# Patient Record
Sex: Female | Born: 1980 | Race: White | Hispanic: No | Smoking: Current every day smoker
Health system: Southern US, Community
[De-identification: ages and names within clinical notes are randomized; demographics above are authoritative.]

## PROBLEM LIST (undated history)

## (undated) DIAGNOSIS — G8929 Other chronic pain: Secondary | ICD-10-CM

## (undated) DIAGNOSIS — IMO0002 Reserved for concepts with insufficient information to code with codable children: Secondary | ICD-10-CM

## (undated) DIAGNOSIS — I2699 Other pulmonary embolism without acute cor pulmonale: Secondary | ICD-10-CM

## (undated) DIAGNOSIS — M549 Dorsalgia, unspecified: Secondary | ICD-10-CM

## (undated) DIAGNOSIS — I82409 Acute embolism and thrombosis of unspecified deep veins of unspecified lower extremity: Secondary | ICD-10-CM

## (undated) HISTORY — PX: TUBAL LIGATION: SHX77

---
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
## 2002-08-03 ENCOUNTER — Emergency Department (HOSPITAL_COMMUNITY): Admission: EM | Admit: 2002-08-03 | Discharge: 2002-08-03 | Payer: Self-pay | Admitting: Internal Medicine

## 2002-08-03 ENCOUNTER — Encounter: Payer: Self-pay | Admitting: *Deleted

## 2002-10-19 ENCOUNTER — Emergency Department (HOSPITAL_COMMUNITY): Admission: EM | Admit: 2002-10-19 | Discharge: 2002-10-19 | Payer: Self-pay | Admitting: Emergency Medicine

## 2002-12-29 ENCOUNTER — Ambulatory Visit (HOSPITAL_COMMUNITY): Admission: RE | Admit: 2002-12-29 | Discharge: 2002-12-29 | Payer: Self-pay | Admitting: Family Medicine

## 2002-12-29 ENCOUNTER — Encounter: Payer: Self-pay | Admitting: Family Medicine

## 2003-05-16 ENCOUNTER — Emergency Department (HOSPITAL_COMMUNITY): Admission: EM | Admit: 2003-05-16 | Discharge: 2003-05-16 | Payer: Self-pay | Admitting: Emergency Medicine

## 2003-08-16 ENCOUNTER — Emergency Department (HOSPITAL_COMMUNITY): Admission: EM | Admit: 2003-08-16 | Discharge: 2003-08-16 | Payer: Self-pay | Admitting: Emergency Medicine

## 2003-12-06 ENCOUNTER — Emergency Department (HOSPITAL_COMMUNITY): Admission: EM | Admit: 2003-12-06 | Discharge: 2003-12-06 | Payer: Self-pay | Admitting: Emergency Medicine

## 2004-01-15 ENCOUNTER — Emergency Department (HOSPITAL_COMMUNITY): Admission: EM | Admit: 2004-01-15 | Discharge: 2004-01-15 | Payer: Self-pay | Admitting: Emergency Medicine

## 2004-02-16 ENCOUNTER — Emergency Department (HOSPITAL_COMMUNITY): Admission: EM | Admit: 2004-02-16 | Discharge: 2004-02-17 | Payer: Self-pay | Admitting: *Deleted

## 2006-01-06 ENCOUNTER — Emergency Department (HOSPITAL_COMMUNITY): Admission: EM | Admit: 2006-01-06 | Discharge: 2006-01-06 | Payer: Self-pay | Admitting: Emergency Medicine

## 2006-02-03 ENCOUNTER — Emergency Department (HOSPITAL_COMMUNITY): Admission: EM | Admit: 2006-02-03 | Discharge: 2006-02-03 | Payer: Self-pay | Admitting: Emergency Medicine

## 2006-03-08 ENCOUNTER — Emergency Department (HOSPITAL_COMMUNITY): Admission: EM | Admit: 2006-03-08 | Discharge: 2006-03-08 | Payer: Self-pay | Admitting: Emergency Medicine

## 2006-04-04 ENCOUNTER — Emergency Department (HOSPITAL_COMMUNITY): Admission: EM | Admit: 2006-04-04 | Discharge: 2006-04-04 | Payer: Self-pay | Admitting: Emergency Medicine

## 2006-05-02 ENCOUNTER — Emergency Department (HOSPITAL_COMMUNITY): Admission: EM | Admit: 2006-05-02 | Discharge: 2006-05-02 | Payer: Self-pay | Admitting: Emergency Medicine

## 2006-05-06 ENCOUNTER — Emergency Department (HOSPITAL_COMMUNITY): Admission: EM | Admit: 2006-05-06 | Discharge: 2006-05-06 | Payer: Self-pay | Admitting: Emergency Medicine

## 2006-06-05 ENCOUNTER — Emergency Department: Payer: Self-pay | Admitting: Emergency Medicine

## 2006-07-06 ENCOUNTER — Observation Stay: Payer: Self-pay

## 2006-10-08 ENCOUNTER — Emergency Department (HOSPITAL_COMMUNITY): Admission: EM | Admit: 2006-10-08 | Discharge: 2006-10-08 | Payer: Self-pay | Admitting: Emergency Medicine

## 2006-12-30 ENCOUNTER — Emergency Department (HOSPITAL_COMMUNITY): Admission: EM | Admit: 2006-12-30 | Discharge: 2006-12-30 | Payer: Self-pay | Admitting: Emergency Medicine

## 2007-02-11 ENCOUNTER — Ambulatory Visit: Payer: Self-pay

## 2007-03-23 ENCOUNTER — Emergency Department: Payer: Self-pay | Admitting: Unknown Physician Specialty

## 2007-04-17 ENCOUNTER — Ambulatory Visit: Payer: Self-pay

## 2007-08-14 ENCOUNTER — Observation Stay: Payer: Self-pay

## 2007-08-24 ENCOUNTER — Inpatient Hospital Stay: Payer: Self-pay

## 2007-12-05 ENCOUNTER — Emergency Department (HOSPITAL_COMMUNITY): Admission: EM | Admit: 2007-12-05 | Discharge: 2007-12-05 | Payer: Self-pay | Admitting: Emergency Medicine

## 2007-12-06 ENCOUNTER — Emergency Department (HOSPITAL_COMMUNITY): Admission: EM | Admit: 2007-12-06 | Discharge: 2007-12-06 | Payer: Self-pay | Admitting: Emergency Medicine

## 2008-10-14 ENCOUNTER — Emergency Department: Payer: Self-pay | Admitting: Internal Medicine

## 2009-02-04 ENCOUNTER — Encounter: Payer: Self-pay | Admitting: Obstetrics and Gynecology

## 2009-04-14 ENCOUNTER — Inpatient Hospital Stay: Payer: Self-pay | Admitting: Obstetrics and Gynecology

## 2009-07-18 ENCOUNTER — Emergency Department (HOSPITAL_COMMUNITY): Admission: EM | Admit: 2009-07-18 | Discharge: 2009-07-18 | Payer: Self-pay | Admitting: Emergency Medicine

## 2009-09-23 ENCOUNTER — Emergency Department (HOSPITAL_COMMUNITY): Admission: EM | Admit: 2009-09-23 | Discharge: 2009-09-23 | Payer: Self-pay | Admitting: Emergency Medicine

## 2009-10-13 ENCOUNTER — Emergency Department (HOSPITAL_COMMUNITY): Admission: EM | Admit: 2009-10-13 | Discharge: 2009-10-13 | Payer: Self-pay | Admitting: Emergency Medicine

## 2010-01-01 ENCOUNTER — Emergency Department (HOSPITAL_COMMUNITY): Admission: EM | Admit: 2010-01-01 | Discharge: 2010-01-01 | Payer: Self-pay | Admitting: Emergency Medicine

## 2010-02-16 ENCOUNTER — Emergency Department (HOSPITAL_COMMUNITY): Admission: EM | Admit: 2010-02-16 | Discharge: 2010-02-16 | Payer: Self-pay | Admitting: Emergency Medicine

## 2010-06-05 ENCOUNTER — Encounter: Payer: Self-pay | Admitting: Neurology

## 2010-06-05 ENCOUNTER — Encounter: Payer: Self-pay | Admitting: Neurosurgery

## 2010-06-18 ENCOUNTER — Inpatient Hospital Stay: Payer: Self-pay | Admitting: Obstetrics and Gynecology

## 2010-07-03 ENCOUNTER — Emergency Department (HOSPITAL_COMMUNITY): Payer: Medicaid Other

## 2010-07-03 ENCOUNTER — Encounter (HOSPITAL_COMMUNITY): Payer: Self-pay

## 2010-07-03 ENCOUNTER — Inpatient Hospital Stay (HOSPITAL_COMMUNITY)
Admission: RE | Admit: 2010-07-03 | Discharge: 2010-07-05 | DRG: 776 | Disposition: A | Discharge status: Home Health | Payer: Medicaid Other | Source: Ambulatory Visit | Attending: Internal Medicine | Admitting: Internal Medicine

## 2010-07-03 DIAGNOSIS — Z832 Family history of diseases of the blood and blood-forming organs and certain disorders involving the immune mechanism: Secondary | ICD-10-CM

## 2010-07-03 DIAGNOSIS — F17200 Nicotine dependence, unspecified, uncomplicated: Secondary | ICD-10-CM | POA: Diagnosis present

## 2010-07-03 DIAGNOSIS — Z79899 Other long term (current) drug therapy: Secondary | ICD-10-CM

## 2010-07-03 DIAGNOSIS — O8823 Thromboembolism in the puerperium: Principal | ICD-10-CM | POA: Diagnosis present

## 2010-07-03 LAB — COMPREHENSIVE METABOLIC PANEL
ALT: 23 U/L (ref 0–35)
BUN: 11 mg/dL (ref 6–23)
CO2: 30 mEq/L (ref 19–32)
Calcium: 9 mg/dL (ref 8.4–10.5)
Creatinine, Ser: 0.7 mg/dL (ref 0.4–1.2)
GFR calc non Af Amer: >60 mL/min (ref >60)
Glucose, Bld: 101 mg/dL — ABNORMAL HIGH (ref 70–99)
Sodium: 140 mEq/L (ref 135–145)

## 2010-07-03 LAB — CBC
MCV: 81.5 fL (ref 78.0–100.0)
Platelets: 289 10*3/uL (ref 150–400)
RBC: 4.43 MIL/uL (ref 3.87–5.11)
WBC: 8.3 10*3/uL (ref 4.0–10.5)

## 2010-07-03 LAB — POCT CARDIAC MARKERS: Myoglobin, poc: 57.1 ng/mL (ref 12–200)

## 2010-07-03 LAB — DIFFERENTIAL
Eosinophils Absolute: 0.2 10*3/uL (ref 0.0–0.7)
Lymphs Abs: 1.6 10*3/uL (ref 0.7–4.0)
Neutrophils Relative %: 74 % (ref 43–77)

## 2010-07-03 LAB — D-DIMER, QUANTITATIVE: D-Dimer, Quant: 4.77 ug/mL-FEU — ABNORMAL HIGH (ref 0.00–0.48)

## 2010-07-03 MED ORDER — IOHEXOL 350 MG/ML SOLN
100.0000 mL | Freq: Once | INTRAVENOUS | Status: AC | PRN
  Administered 2010-07-03: 100 mL via INTRAVENOUS

## 2010-07-04 LAB — CARDIAC PANEL(CRET KIN+CKTOT+MB+TROPI)
Relative Index: INVALID (ref 0.0–2.5)
Relative Index: INVALID (ref 0.0–2.5)
Total CK: 35 U/L (ref 7–177)
Total CK: 32 U/L (ref 7–177)
Troponin I: <0.01 ng/mL (ref 0.00–0.06)
Troponin I: <0.01 ng/mL (ref 0.00–0.06)

## 2010-07-04 LAB — IRON AND TIBC
TIBC: 379 ug/dL (ref 250–470)
UIBC: 361 ug/dL

## 2010-07-04 LAB — VITAMIN B12: Vitamin B-12: 315 pg/mL (ref 211–911)

## 2010-07-04 LAB — COMPREHENSIVE METABOLIC PANEL
ALT: 21 U/L (ref 0–35)
Alkaline Phosphatase: 77 U/L (ref 39–117)
CO2: 28 mEq/L (ref 19–32)
Chloride: 100 mEq/L (ref 96–112)
GFR calc non Af Amer: >60 mL/min (ref >60)
Glucose, Bld: 96 mg/dL (ref 70–99)
Potassium: 3.6 mEq/L (ref 3.5–5.1)
Sodium: 136 mEq/L (ref 135–145)

## 2010-07-04 LAB — CBC
HCT: 33.8 % — ABNORMAL LOW (ref 36.0–46.0)
Hemoglobin: 10.3 g/dL — ABNORMAL LOW (ref 12.0–15.0)
RBC: 4.16 MIL/uL (ref 3.87–5.11)
WBC: 8.3 10*3/uL (ref 4.0–10.5)

## 2010-07-04 LAB — DIFFERENTIAL
Basophils Absolute: 0 10*3/uL (ref 0.0–0.1)
Lymphocytes Relative: 29 % (ref 12–46)
Monocytes Absolute: 0.5 10*3/uL (ref 0.1–1.0)
Neutro Abs: 5.3 10*3/uL (ref 1.7–7.7)

## 2010-07-04 LAB — PROTIME-INR: Prothrombin Time: 13.3 seconds (ref 11.6–15.2)

## 2010-07-04 LAB — FOLATE: Folate: 5.8 ng/mL

## 2010-07-05 LAB — PROTIME-INR
INR: 1.03 (ref 0.00–1.49)
Prothrombin Time: 13.7 seconds (ref 11.6–15.2)

## 2010-07-05 LAB — DIFFERENTIAL
Basophils Absolute: 0 10*3/uL (ref 0.0–0.1)
Basophils Relative: 0 % (ref 0–1)
Eosinophils Absolute: 0.1 10*3/uL (ref 0.0–0.7)
Monocytes Absolute: 0.5 10*3/uL (ref 0.1–1.0)
Neutro Abs: 4.4 10*3/uL (ref 1.7–7.7)

## 2010-07-05 LAB — CBC
Hemoglobin: 10.3 g/dL — ABNORMAL LOW (ref 12.0–15.0)
MCHC: 30.2 g/dL (ref 30.0–36.0)
Platelets: 339 10*3/uL (ref 150–400)
RDW: 15.8 % — ABNORMAL HIGH (ref 11.5–15.5)

## 2010-07-10 NOTE — Discharge Summary (Signed)
  NAMEAMESHA, [name]                 ACCOUNT NO.:  [id number]  MEDICAL RECORD NO.:  [id number]           PATIENT TYPE:  I  LOCATION:  A305                          FACILITY:  [facility]  PHYSICIAN:  Brysun Eschmann L. Lendell Caprice, MDDATE OF BIRTH:  02/24/1981  DATE OF ADMISSION:  07/03/2010 DATE OF DISCHARGE:  02/21/2012LH                              DISCHARGE SUMMARY   DISCHARGE DIAGNOSES: 1. Acute pulmonary embolus. 2. Recent vaginal delivery of a full-term infant. 3. Tobacco abuse. 4. Chronic methadone maintenance.  DISCHARGE MEDICATIONS: 1. Lovenox 120 mg daily until Saturday or continue until INR is     greater than or equal to 2.0. 2. Coumadin 5 mg nightly or as directed to keep INR between 2 and 3. 3. Methadone 200 mg a day.  CONDITION:  Stable.  ACTIVITY:  Ad lib.  Diet should be Coumadin compatible.  FOLLOWUP:  Dr. Delbert Harness next week for Coumadin management.  Home health will draw INR on Friday and then daily if less than 2.0.  They also will assist with her Lovenox injections.  CONSULTATIONS:  None.  PROCEDURES:  None.  LABORATORY DATA:  CBC on admission significant for a hemoglobin of 11, hematocrit 36, otherwise normal D-dimer 4.77.  Complete metabolic panel significant for an albumin of 2.9.  Cardiac enzymes negative.  Ferritin is 19, normal folate, normal B12, normal TIBC, iron is 18.  DIAGNOSTICS:  Chest x-ray showed nothing acute.  CT angiogram of the chest showed left lower lobe segmental and subsegmental pulmonary emboli.  EKG showed normal sinus rhythm.  HISTORY AND HOSPITAL COURSE:  Please see H and P for details.  Ann Hoover is a 30 year old white female who has seen Dr. Delbert Harness in the past, but is not followed by him regularly.  She had a vaginal delivery 12 days ago.  She presented with chest pain.  She had normal vital signs and clear lungs, regular heart rhythm.  She was found to have a PE, which is most likely related to the recent delivery and also her  tobacco abuse.  She was started on Lovenox and Coumadin.  She is breast-feeding and safety and nursing mother's of Lovenox and Coumadin was confirmed with the pharmacist.  She was requesting discharge as soon as possible to get back to her infant and five other children.  She was given Lovenox teaching.  She felt comfortable self-administering Lovenox, and we will send her home with a nurse to assist and check INRs.  She wishes to see Dr. Delbert Harness again, and I have asked the secretary to set up an appointment.  I encouraged her to quit smoking.  Her vital signs, Telemetry have been stable and she may be safely discharged with a 5-day of bridge of Coumadin and Lovenox.  Total time on the day of discharge is greater than 30 minutes.     Ann Hoover L. Lendell Caprice, MD     CLS/MEDQ  D:  07/05/2010  T:  07/06/2010  Job:  [id number]  cc:   Delbert Harness, MD  Electronically Signed by Crista Curb MD on 07/10/2010 09:28:49 PM

## 2010-07-11 NOTE — H&P (Signed)
Ann Hoover, Ann Hoover NO.:  [id number]  MEDICAL RECORD NO.:  [id number]           PATIENT TYPE:  E  LOCATION:  [facility]                          FACILITY:  [facility]  PHYSICIAN:  Eduard Clos, MDDATE OF BIRTH:  [birth year]  DATE OF ADMISSION:  07/03/2010 DATE OF DISCHARGE:  LH                             HISTORY & PHYSICAL   PRIMARY CARE PHYSICIAN:  Unassigned.  CHIEF COMPLAINT:  Chest pain.  HISTORY OF PRESENT ILLNESS:  A 29 year old female who had just delivered a baby 12 days ago, started having some chest pain on the left side, pleuritic in nature.  Denies any shortness of breath, cough, fever, or chills.  As the chest has persisted, the patient came to the ER where her D-dimer was found to be high.  Due to this, the patient was admitted for further workup.  The patient states that she did notice some swelling of her lower extremity few days ago, which has resolved at this time.  Denies any nausea, vomiting, or abdominal pain.  Denies any dysuria, discharge, or diarrhea.  PAST MEDICAL HISTORY:  Nothing significant.  PAST SURGICAL HISTORY:  None.  MEDICATIONS PRIOR TO ADMISSION:  None.  SOCIAL HISTORY:  The patient smokes cigarettes.  Denies any alcohol or drug abuse.  FAMILY HISTORY:  Strongly positive for PE in one of her uncles and 2 of her aunts had DVT.  ALLERGIES:  No known drug allergies.  REVIEW OF SYSTEMS:  As per the history of present illness, nothing else significant.  PHYSICAL EXAMINATION:  GENERAL:  The patient examined at bedside, not in acute distress. VITAL SIGNS:  Blood pressure 126/50, pulse 80 per minute, temperature 98.1, respirations 18 per minute, O2 sats 100%. HEENT:  Anicteric.  No pallor.  No discharge from ears, eyes, nose, or mouth. CHEST:  Bilateral air entry present.  No rhonchi.  No crepitation. HEART:  S1 and S2 heard. ABDOMEN:  Soft, nontender.  Bowel sounds heard. NEUROLOGIC:  Alert, awake, and oriented to  place and person. EXTREMITIES:  Moves upper and lower extremities, 5/5 strength. Peripheral pulses felt.  No edema.  LABORATORY DATA:  EKG shows normal sinus rhythm with beats around 57 beats per minute with nonspecific ST-T changes.  CBC; WBC is 8.3, hemoglobin 11.1, hematocrit 36.1, platelets 289.  D-dimer is 4.77. Complete metabolic panel; sodium 140, potassium 3.8, chloride 103, carbon dioxide 30, glucose 111, BUN 11, creatinine 0.7, alkaline phosphatase 89, AST 17, ALT 23, total protein 6.6, albumin 2.9, calcium 9, CK-MB less than 1, troponin less than 0.05, myoglobin is 57.1.  CT angio of the chest shows left lower lobe segmental and subsegmental pulmonary emboli.  Chest x-ray shows no active cardiopulmonary process.  ASSESSMENT: 1. Pulmonary embolism. 2. Pleuritic type of chest pain. 3. Tobacco abuse. 4. We will admit the patient to telemetry. 5. For her PE, the patient is already started on Lovenox and Coumadin.     The patient does have a strong family history of PE and DVTs.  At this time, the patient will be continued on Lovenox and Coumadin with bridging.  I already confirmed  the safety of Lovenox and Coumadin for a nursing mother with the pharmacist.  The patient will need hypercoagulable workup after the patient has taken Coumadin at least for 6 months.  At this time, the patient has already got Lovenox.  The patient will need tobacco cessation counseling.  The patient does have anemia.  We will check anemia profile.  Further recommendation as condition evolves.     Eduard Clos, MD     ANK/MEDQ  D:  07/04/2010  T:  07/04/2010  Job:  [id number]  Electronically Signed by Midge Minium MD on 07/11/2010 05:00:14 PM

## 2010-07-27 LAB — DIFFERENTIAL
Basophils Absolute: 0.1 10*3/uL (ref 0.0–0.1)
Basophils Relative: 1 % (ref 0–1)
Lymphocytes Relative: 25 % (ref 12–46)
Neutro Abs: 6.1 10*3/uL (ref 1.7–7.7)
Neutrophils Relative %: 67 % (ref 43–77)

## 2010-07-27 LAB — CBC
MCHC: 33.8 g/dL (ref 30.0–36.0)
Platelets: 184 10*3/uL (ref 150–400)
RDW: 15 % (ref 11.5–15.5)
WBC: 9.1 10*3/uL (ref 4.0–10.5)

## 2010-07-27 LAB — BASIC METABOLIC PANEL
BUN: 5 mg/dL — ABNORMAL LOW (ref 6–23)
Calcium: 9.1 mg/dL (ref 8.4–10.5)
Creatinine, Ser: 0.55 mg/dL (ref 0.4–1.2)
GFR calc non Af Amer: >60 mL/min (ref >60)
Glucose, Bld: 90 mg/dL (ref 70–99)
Potassium: 3.6 mEq/L (ref 3.5–5.1)

## 2010-07-27 LAB — ABO/RH: ABO/RH(D): A POS

## 2010-07-27 LAB — HCG, QUANTITATIVE, PREGNANCY: hCG, Beta Chain, Quant, S: 11390 m[IU]/mL — ABNORMAL HIGH (ref <5)

## 2010-08-02 LAB — URINALYSIS, ROUTINE W REFLEX MICROSCOPIC
Glucose, UA: NEGATIVE mg/dL
Nitrite: POSITIVE — AB
Protein, ur: NEGATIVE mg/dL

## 2010-08-02 LAB — POCT PREGNANCY, URINE: Preg Test, Ur: NEGATIVE

## 2010-08-02 LAB — URINE MICROSCOPIC-ADD ON

## 2010-09-30 NOTE — Op Note (Signed)
[facility]  Patient:    Ann Hoover, Ann Hoover Visit Number: [id number] MRN: [id number]          Service Type: MED Location: 4A A418 01 Attending Physician:  Tilda Burrow Dictated by:   Christin Bach, M.D. Proc. Date: 06/02/01 Admit Date:  06/02/2001   CC:         Lilyan Punt, M.D.   Operative Report  DELIVERY NOTE  DELIVERY TIME:  2:55 p.m.  DESCRIPTION OF PROCEDURE:  Ann Hoover progressed nicely progressing from 6 cm to completely dilated in less than one hour on Pitocin augmentation of labor. Intravenous analgesia was very effective.  She began to develop variable decelerations with heart rate remaining in the 70s during second stage labor Some vacuum assistance was requested utilized in assisting to bring the baby to crowning position whereupon it was removed, and she was able to expel the baby the rest of the way.  The infant was a large stocky 7 pound 13.6 ounce female, Apgars 8 and 9 without any obvious nuchal or body cord.  The infant responded well to tactile stimulation and had cord clamped.  Cord blood gas was obtained and placenta delivered easily, Tomasa Blase presentation with a large placenta, 25 cm in approximate diameter, central cord insertion and normal appearance. Estimated blood loss was 250 cc.  Perineum had first degree lacerations only, none requiring suture repair. The patient was attended by two family members. Dictated by:   Christin Bach, M.D. Attending Physician:  Tilda Burrow DD:  06/02/01 TD:  06/04/01 Job: [id number]

## 2010-09-30 NOTE — H&P (Signed)
[facility]  Patient:    Ann Hoover, Ann Hoover Visit Number: [id number] MRN: [id number]          Service Type: MED Location: 4A A418 01 Attending Physician:  Tilda Burrow Dictated by:   Christin Bach, M.D. Admit Date:  06/02/2001   CC:         Dr. [facility]   History and Physical  ADMISSION DIAGNOSES:  1. Pregnancy [redacted] weeks gestation. 2. Spontaneous rupture of membranes. 3. Latent phase labor.  HISTORY OF PRESENT ILLNESS:  This 30 year old female, gravida 2, para 1, AB 0, LMP 08/27/00, placing her [facility] January 20 with ultrasound corrected EDC of February 2, based on 20-week ultrasound; is admitted, after presenting on the morning of June 02, 2001, complaining of spontaneous rupture of membranes occurring at 7:30 a.m. today with no bleeding.  Presentation to labor and delivery at approximately 8:30 a.m. shows fetal well being, reactive strip, with occasional mild contractions that are still irregular in nature. Membranes are ruptured and the amniotic fluid is clear and cervical dilation is 3-cm, 90%, 0 station with cervix still quite posterior and soft.  Prognosis for vaginal delivery is excellent.  The patient is admitted for labor management.  Will receive epidural analgesia most likely, and will require Pitocin augmentation of labor.  PAST MEDICAL HISTORY:  Benign.  PAST SURGICAL HISTORY:  Negative.  ALLERGIES:  No known drug allergies.  SOCIAL HISTORY:  Single, stable relationship with boyfriend for 2 years.  PHYSICAL EXAMINATION:  Height 5 feet 8 inches, weight 135, which is a 24-pound weight gain.  Fundal height 37 cm, vertex presentation.  EYES:  Pupils equal round and reactive.  Extraocular movements intact.  NECK:  Supple.  Trachea midline.  CHEST:  Clear to auscultation.  ABDOMEN:  Fundal height 37 cm, vertex presentation, reactive NST.  EXTREMITIES:  Grossly normal.  ASSESSMENT:  Pregnancy at term,  latent phase labor at best.  PLAN:  Pitocin augmentation.  Good prognosis for vaginal delivery.  ADDENDUM:  Patient aware that Dr. Benjie Karvonen is covering after 3 p.m. Dictated by:   Christin Bach, M.D. Attending Physician:  Tilda Burrow DD:  06/02/01 TD:  06/02/01 Job: [id number]

## 2011-02-04 ENCOUNTER — Encounter (HOSPITAL_COMMUNITY): Payer: Self-pay

## 2011-02-04 ENCOUNTER — Emergency Department (HOSPITAL_COMMUNITY)
Admission: EM | Admit: 2011-02-04 | Discharge: 2011-02-04 | Disposition: A | Discharge status: Home / Self Care | Payer: Medicaid Other | Attending: Emergency Medicine | Admitting: Emergency Medicine

## 2011-02-04 ENCOUNTER — Other Ambulatory Visit: Payer: Self-pay

## 2011-02-04 DIAGNOSIS — N390 Urinary tract infection, site not specified: Secondary | ICD-10-CM

## 2011-02-04 DIAGNOSIS — Z331 Pregnant state, incidental: Secondary | ICD-10-CM | POA: Insufficient documentation

## 2011-02-04 DIAGNOSIS — O219 Vomiting of pregnancy, unspecified: Secondary | ICD-10-CM

## 2011-02-04 DIAGNOSIS — R0789 Other chest pain: Secondary | ICD-10-CM | POA: Insufficient documentation

## 2011-02-04 HISTORY — DX: Reserved for concepts with insufficient information to code with codable children: IMO0002

## 2011-02-04 LAB — POCT PREGNANCY, URINE: Preg Test, Ur: POSITIVE

## 2011-02-04 LAB — DIFFERENTIAL
Basophils Absolute: 0 10*3/uL (ref 0.0–0.1)
Eosinophils Absolute: 0.1 10*3/uL (ref 0.0–0.7)
Eosinophils Relative: 1 % (ref 0–5)
Lymphocytes Relative: 23 % (ref 12–46)
Lymphs Abs: 1.8 10*3/uL (ref 0.7–4.0)
Neutrophils Relative %: 70 % (ref 43–77)

## 2011-02-04 LAB — COMPREHENSIVE METABOLIC PANEL
Alkaline Phosphatase: 65 U/L (ref 39–117)
BUN: 9 mg/dL (ref 6–23)
CO2: 28 mEq/L (ref 19–32)
Chloride: 100 mEq/L (ref 96–112)
Creatinine, Ser: 0.74 mg/dL (ref 0.50–1.10)
GFR calc non Af Amer: >60 mL/min (ref >60)
Glucose, Bld: 97 mg/dL (ref 70–99)
Potassium: 4.4 mEq/L (ref 3.5–5.1)
Total Bilirubin: 0.3 mg/dL (ref 0.3–1.2)

## 2011-02-04 LAB — URINALYSIS, ROUTINE W REFLEX MICROSCOPIC
Glucose, UA: NEGATIVE mg/dL
pH: 6.5 (ref 5.0–8.0)

## 2011-02-04 LAB — URINE MICROSCOPIC-ADD ON

## 2011-02-04 LAB — LIPASE, BLOOD: Lipase: 25 U/L (ref 11–59)

## 2011-02-04 LAB — CBC
Platelets: 251 10*3/uL (ref 150–400)
RBC: 5.09 MIL/uL (ref 3.87–5.11)
RDW: 14.8 % (ref 11.5–15.5)
WBC: 8 10*3/uL (ref 4.0–10.5)

## 2011-02-04 MED ORDER — ONDANSETRON 8 MG PO TBDP
8.0000 mg | ORAL_TABLET | Freq: Once | ORAL | Status: AC
  Administered 2011-02-04: 8 mg via ORAL
  Filled 2011-02-04: qty 1

## 2011-02-04 MED ORDER — CEPHALEXIN 500 MG PO CAPS: 500.0000 mg | ORAL_CAPSULE | Freq: Four times a day (QID) | ORAL | Status: AC

## 2011-02-04 MED ORDER — ONDANSETRON 4 MG PO TBDP: 4.0000 mg | ORAL_TABLET | Freq: Four times a day (QID) | ORAL | Status: AC | PRN

## 2011-02-04 MED ORDER — CEPHALEXIN 500 MG PO CAPS
500.0000 mg | ORAL_CAPSULE | Freq: Once | ORAL | Status: AC
  Administered 2011-02-04: 500 mg via ORAL
  Filled 2011-02-04: qty 1

## 2011-02-04 NOTE — ED Notes (Signed)
Have ask pt to use the bathroom for some unrie

## 2011-02-04 NOTE — ED Provider Notes (Signed)
History  Scribed for Dr. Clarene Duke, the patient was seen in room 19. The chart was scribed by Gilman Schmidt. The patients care was started at 1241.   CSN: [id number] Arrival date & time: 02/04/2011 10:31 AM  Chief Complaint  Patient presents with  . Emesis  . Chest Pain    HPI   HPI Pt. Seen at 1241.Ann Hoover is a 30 y.o. female who presents to the Emergency Department complaining of gradual onset and persistence of multiple intermittent episodes of N/V x10 days.  Has been taking phenergan without relief.  States after vomiting her "chest hurts."   Denies SOB/cough, no abd pain, no back or flank pain, no fevers, no diarrhea, no black or blood in stools or emesis. There are no other associated symptoms and no other alleviating or aggravating factors.  LMP: 2-3 weeks ago.    PAST MEDICAL HISTORY:  Past Medical History  Diagnosis Date  . Herniated disc      PAST SURGICAL HISTORY:  History reviewed. No pertinent past surgical history.   MEDICATIONS:  Previous Medications   No medications on file     ALLERGIES:  Allergies as of 02/04/2011  . (No Known Allergies)      SOCIAL HISTORY: History  Substance Use Topics  . Smoking status: Former Games developer  . Smokeless tobacco: Not on file  . Alcohol Use: No     Review of Systems  Review of Systems ROS: Statement: All systems negative except as marked or noted in the HPI; Constitutional: Negative for fever and chills. ; ; Eyes: Negative for eye pain, redness and discharge. ; ; ENMT: Negative for ear pain, hoarseness, nasal congestion, sinus pressure and sore throat. ; ; Cardiovascular: Negative for chest pain, palpitations, diaphoresis, dyspnea and peripheral edema. ; ; Respiratory: Negative for cough, wheezing and stridor. ; ; Gastrointestinal: +N/V.  Negative for diarrhea and abdominal pain, blood in stool, hematemesis, jaundice and rectal bleeding. . ; ; Genitourinary: Negative for dysuria, flank pain and hematuria. ; ;  Musculoskeletal: Negative for back pain and neck pain. Negative for swelling and trauma.; ; Skin: Negative for pruritus, rash, abrasions, blisters, bruising and skin lesion.; ; Neuro: Negative for headache, lightheadedness and neck stiffness. Negative for weakness, altered level of consciousness , altered mental status, extremity weakness, paresthesias, involuntary movement, seizure and syncope.     Physical Exam    BP 118/51  Pulse 62  Temp(Src) 98.4 F (36.9 C) (Oral)  Resp 18  Ht 5\' 8"  (1.727 m)  Wt 159 lb 11.2 oz (72.439 kg)  BMI 24.28 kg/m2  SpO2 100%  LMP 01/21/2011  Physical Exam 1246: Physical examination:  Nursing notes reviewed; Vital signs and O2 SAT reviewed;  Constitutional: Well developed, Well nourished, Well hydrated, In no acute distress; Head:  Normocephalic, atraumatic; Eyes: EOMI, PERRL, No scleral icterus; ENMT: Mouth and pharynx normal, Mucous membranes moist; Neck: Supple, Full range of motion, No lymphadenopathy; Cardiovascular: Regular rate and rhythm, No murmur, rub, or gallop; Respiratory: Breath sounds clear & equal bilaterally, No rales, rhonchi, wheezes, or rub, Normal respiratory effort/excursion; Chest: Nontender, Movement normal; Abdomen: Soft, Nontender, Nondistended, Normal bowel sounds; Extremities: Pulses normal, No tenderness, No edema, No calf edema or asymmetry.; Neuro: AA&Ox3, Major CN grossly intact.  Gait steady.  No gross focal motor or sensory deficits in extremities.; Skin: Color normal, Warm, Dry.   ED Course  Procedures   MDM Reviewed: nursing note and vitals Interpretation: labs    Date: 02/04/2011  Rate: 73  Rhythm:  normal sinus rhythm and sinus arrhythmia  QRS Axis: normal  Intervals: normal  ST/T Wave abnormalities: normal  Conduction Disutrbances:none  Narrative Interpretation:   Old EKG Reviewed: unchanged from previous EKG dated 07/03/2010.    LABS:  Results for orders placed during the hospital encounter of 02/04/11    URINALYSIS, ROUTINE W REFLEX MICROSCOPIC      Component Value Range   Color, Urine YELLOW  YELLOW    Appearance CLOUDY (*) CLEAR    Specific Gravity, Urine 1.025  1.005 - 1.030    pH 6.5  5.0 - 8.0    Glucose, UA NEGATIVE  NEGATIVE (mg/dL)   Hgb urine dipstick TRACE (*) NEGATIVE    Bilirubin Urine SMALL (*) NEGATIVE    Ketones, ur TRACE (*) NEGATIVE (mg/dL)   Protein, ur NEGATIVE  NEGATIVE (mg/dL)   Urobilinogen, UA 1.0  0.0 - 1.0 (mg/dL)   Nitrite POSITIVE (*) NEGATIVE    Leukocytes, UA MODERATE (*) NEGATIVE   COMPREHENSIVE METABOLIC PANEL      Component Value Range   Sodium 136  135 - 145 (mEq/L)   Potassium 4.4  3.5 - 5.1 (mEq/L)   Chloride 100  96 - 112 (mEq/L)   CO2 28  19 - 32 (mEq/L)   Glucose, Bld 97  70 - 99 (mg/dL)   BUN 9  6 - 23 (mg/dL)   Creatinine, Ser 4.09  0.50 - 1.10 (mg/dL)   Calcium 9.9  8.4 - 81.1 (mg/dL)   Total Protein 7.1  6.0 - 8.3 (g/dL)   Albumin 3.8  3.5 - 5.2 (g/dL)   AST 14  0 - 37 (U/L)   ALT 48 (*) 0 - 35 (U/L)   Alkaline Phosphatase 65  39 - 117 (U/L)   Total Bilirubin 0.3  0.3 - 1.2 (mg/dL)   GFR calc non Af Amer >60  >60 (mL/min)   GFR calc Af Amer >60  >60 (mL/min)  CBC      Component Value Range   WBC 8.0  4.0 - 10.5 (K/uL)   RBC 5.09  3.87 - 5.11 (MIL/uL)   Hemoglobin 13.4  12.0 - 15.0 (g/dL)   HCT 91.4  78.2 - 95.6 (%)   MCV 78.8  78.0 - 100.0 (fL)   MCH 26.3  26.0 - 34.0 (pg)   MCHC 33.4  30.0 - 36.0 (g/dL)   RDW 21.3  08.6 - 57.8 (%)   Platelets 251  150 - 400 (K/uL)  DIFFERENTIAL      Component Value Range   Neutrophils Relative 70  43 - 77 (%)   Neutro Abs 5.5  1.7 - 7.7 (K/uL)   Lymphocytes Relative 23  12 - 46 (%)   Lymphs Abs 1.8  0.7 - 4.0 (K/uL)   Monocytes Relative 6  3 - 12 (%)   Monocytes Absolute 0.5  0.1 - 1.0 (K/uL)   Eosinophils Relative 1  0 - 5 (%)   Eosinophils Absolute 0.1  0.0 - 0.7 (K/uL)   Basophils Relative 1  0 - 1 (%)   Basophils Absolute 0.0  0.0 - 0.1 (K/uL)  LIPASE, BLOOD      Component Value  Range   Lipase 25  11 - 59 (U/L)  POCT PREGNANCY, URINE      Component Value Range   Preg Test, Ur POSITIVE    URINE MICROSCOPIC-ADD ON      Component Value Range   Squamous Epithelial / LPF MANY (*) RARE    WBC, UA 11-20  <  3 (WBC/hpf)   RBC / HPF 11-20  <3 (RBC/hpf)   Bacteria, UA MANY (*) RARE    3:43 PM:  Feels improved, wants to go home now.  Has tol PO well while in ED, ambulatory with steady gait.  Will tx for UTI while UC pending.  Continues to deny abd or pelvic pain, exam benign.  Dx testing d/w pt.  Questions answered.  Verb understanding, agreeable to d/c home with outpt f/u.   Medications given in ED:  ondansetron (ZOFRAN-ODT) disintegrating tablet 8 mg (8 mg Oral Given 02/04/11 1251)  cephALEXin (KEFLEX) capsule 500 mg (500 mg Oral Given 02/04/11 1554)   New Prescriptions   CEPHALEXIN (KEFLEX) 500 MG CAPSULE    Take 1 capsule (500 mg total) by mouth 4 (four) times daily.   ONDANSETRON (ZOFRAN ODT) 4 MG DISINTEGRATING TABLET    Take 1 tablet (4 mg total) by mouth every 6 (six) hours as needed for nausea.      [facility] M      I personally performed the services described in this documentation, which was scribed in my presence. The recorded information has been reviewed and considered. Keion Neels Allison Quarry, DO 02/04/11 2017

## 2011-02-04 NOTE — Discharge Instructions (Signed)
Morning Sickness Morning sickness is when you feel sick to your stomach (nauseous) during pregnancy. This nauseous feeling may or may not come with throwing up (vomiting). It often occurs in the morning, but can be a problem any time of day. While morning sickness is unpleasant, it is usually harmless unless you develop severe and continual vomiting (hyperemesis gravidarum). This condition requires more intense treatment. CAUSES The cause of morning sickness is not completely known but seems to be related to a sudden increase of two hormones:   Human chorionic gonadotropin (hCG).   Estrogen hormone.  These are elevated in the first part of the pregnancy. TREATMENT Do not use any medicines (prescription, over-the-counter, or herbal) for morning sickness without first talking to your caregiver. Some patients are helped by the following:  Vitamin B6 (25mg  every 8 hours) or vitamin B6 shots.   An antihistamine called doxylamine (10mg  every 8 hours).   The herbal medication ginger.  HOME CARE INSTRUCTIONS  Taking multivitamins before getting pregnant can prevent or decrease the severity of morning sickness in most women.  Eat a piece of dry toast or unsalted crackers before getting out of bed in the morning.   Eat 5 or 6 small meals a day.   Eat dry and bland foods (rice, baked potato).   Do not drink liquids with your meals. Drink liquids between meals.   Avoid greasy, fatty, and spicy foods.   Get someone to cook for you if the smell of any food causes nausea and vomiting.   Avoid vitamin pills with iron because iron can cause nausea.  Snack on protein foods between meals if you are hungry.   Eat unsweetened gelatins for deserts.   Wear an acupressure wristband (worn for sea sickness) may be helpful.   Acupuncture may be helpful.   Do not smoke.   Get a humidifier to keep the air in your house free of odors.   SEEK MEDICAL CARE IF:  Your home remedies are not working and  you need medication.   You feel dizzy or lightheaded.   You are losing weight.   You need help with your diet.  You have an oral temperature above 101. SEEK IMMEDIATE MEDICAL CARE IF:  You have persistent and uncontrolled nausea and vomiting.   You pass out (faint).   You have an oral temperature above 101, not controlled by medicine.  MAKE SURE YOU:   Understand these instructions.   Will watch your condition.   Will get help right away if you are not doing well or get worse.  Document Released: 06/22/2006 Document Re-Released: 10/19/2009 [facility] Patient Information 2011 [place], [place].   Take the prescription as directed.  Increase your fluid intake (ie:  Gatoraide) for the next few days, as discussed.  Eat a bland diet and advance to your regular diet slowly as you can tolerate it.  Call your regular OB/GYN doctor on Monday morning to schedule a follow up appointment within the next week.  Return to the Emergency Department immediately if not improving (or even worsening) despite taking the medicines as prescribed, any black or bloody stool or vomit, if you develop a fever, or for any other concerns.

## 2011-02-04 NOTE — ED Notes (Signed)
Pt presents with n/v and chest pain for 10 days. Pt states chest pain is intermittent and she states "Im unable to keep anything down". Pt denies abd pain and any GU symptoms. Pt has been using OTC medication and Phenergan but has recieved no relief.

## 2011-02-04 NOTE — ED Notes (Signed)
Pt given a coke per her request

## 2011-02-06 LAB — URINE CULTURE: Culture  Setup Time: 201209222030

## 2011-02-10 LAB — URINALYSIS, ROUTINE W REFLEX MICROSCOPIC
Glucose, UA: NEGATIVE
Ketones, ur: >80 — AB
Protein, ur: 100 — AB
Urobilinogen, UA: 0.2

## 2011-02-10 LAB — URINE MICROSCOPIC-ADD ON

## 2011-02-24 LAB — COMPREHENSIVE METABOLIC PANEL
AST: 13
CO2: 25
Calcium: 9.4
Creatinine, Ser: 0.73
GFR calc Af Amer: >60
GFR calc non Af Amer: >60
Sodium: 138
Total Protein: 7

## 2011-02-24 LAB — DIFFERENTIAL
Eosinophils Relative: 0
Lymphocytes Relative: 18
Lymphs Abs: 1.5
Monocytes Relative: 4

## 2011-02-24 LAB — CBC
MCHC: 34.6
MCV: 86.1
Platelets: 314
RBC: 4.86
RDW: 13.6

## 2011-02-24 LAB — ETHANOL: Alcohol, Ethyl (B): <5

## 2011-05-31 ENCOUNTER — Encounter (HOSPITAL_COMMUNITY): Payer: Self-pay | Admitting: *Deleted

## 2011-05-31 ENCOUNTER — Emergency Department (HOSPITAL_COMMUNITY)
Admission: EM | Admit: 2011-05-31 | Discharge: 2011-05-31 | Disposition: A | Discharge status: Home / Self Care | Payer: Self-pay | Attending: Emergency Medicine | Admitting: Emergency Medicine

## 2011-05-31 DIAGNOSIS — R1110 Vomiting, unspecified: Secondary | ICD-10-CM | POA: Insufficient documentation

## 2011-05-31 DIAGNOSIS — M549 Dorsalgia, unspecified: Secondary | ICD-10-CM | POA: Insufficient documentation

## 2011-05-31 DIAGNOSIS — G8929 Other chronic pain: Secondary | ICD-10-CM | POA: Insufficient documentation

## 2011-05-31 DIAGNOSIS — O9989 Other specified diseases and conditions complicating pregnancy, childbirth and the puerperium: Secondary | ICD-10-CM | POA: Insufficient documentation

## 2011-05-31 DIAGNOSIS — R109 Unspecified abdominal pain: Secondary | ICD-10-CM | POA: Insufficient documentation

## 2011-05-31 DIAGNOSIS — Z86718 Personal history of other venous thrombosis and embolism: Secondary | ICD-10-CM | POA: Insufficient documentation

## 2011-05-31 HISTORY — DX: Acute embolism and thrombosis of unspecified deep veins of unspecified lower extremity: I82.409

## 2011-05-31 HISTORY — DX: Other pulmonary embolism without acute cor pulmonale: I26.99

## 2011-05-31 LAB — URINALYSIS, ROUTINE W REFLEX MICROSCOPIC
Glucose, UA: NEGATIVE mg/dL
Hgb urine dipstick: NEGATIVE
Ketones, ur: NEGATIVE mg/dL
Leukocytes, UA: NEGATIVE
pH: 6 (ref 5.0–8.0)

## 2011-05-31 NOTE — Progress Notes (Signed)
FHT 140 on bedside ultrasound.  Pt beginning to feel good fetal movement at this time.  Fetal monitoring discontinued at request of physician.  Physician at bedside, at this time.  Pt to discharge home.  Info provided to me by [place], [facility]

## 2011-05-31 NOTE — ED Notes (Signed)
MD at bedside to speak with patient about plan of care.

## 2011-05-31 NOTE — ED Notes (Signed)
Back pain

## 2011-05-31 NOTE — ED Notes (Signed)
Patient report given to this nurse. Assuming care of patient.  

## 2011-05-31 NOTE — ED Notes (Signed)
Pregnant , 26 weeks, No movement for 2 days.  No bleeding.  "sharp pain" RLQ

## 2011-05-31 NOTE — ED Notes (Signed)
Patient taken off of monitor to ambulate to bathroom to obtain clean catch urine.

## 2011-05-31 NOTE — ED Provider Notes (Signed)
History     CSN: [id number]  Arrival date & time 05/31/11  1728   First MD Initiated Contact with Patient 05/31/11 1807      Chief Complaint  Patient presents with  . Back Pain    The history is provided by the patient.  decreased fetal movement Onset - 2 days ago Course - worsening Improved by - nothing Worsened by - nothing Associated symptoms - RLQ abd pain  Pt is G7P6 Pt reports she is approximately [redacted] weeks pregnant by LMP (due date is 09/26/11) She reports no fetal movement for two days No vag bleeding No vag discharge, no vaginal "gush" of fluid Reports intermittent sharp RLQ pain that is brief No contractions reported No complications thus far in this pregnancy No falls reported Reports chronic back pain that is not new She has not seen her OB for this decreased fetal movement She reports all deliveries were uncomplicated vaginal deliveries +vomiting yesterday but none today No diarrhea No fever No dysuria    Past Medical History  Diagnosis Date  . Herniated disc   . DVT (deep venous thrombosis)   . Pulmonary embolus     History reviewed. No pertinent past surgical history.  Family History  Problem Relation Age of Onset  . Hyperlipidemia Mother   . Diabetes Father     History  Substance Use Topics  . Smoking status: Current Everyday Smoker    Types: Cigarettes  . Smokeless tobacco: Not on file  . Alcohol Use: No    OB History    Grav Para Term Preterm Abortions TAB SAB Ect Mult Living                  Review of Systems  All other systems reviewed and are negative.    Allergies  Review of patient's allergies indicates no known allergies.  Home Medications  No current outpatient prescriptions on file.  Vitals reviewed  Physical Exam CONSTITUTIONAL: Well developed/well nourished HEAD AND FACE: Normocephalic/atraumatic EYES: EOMI/PERRL ENMT: Mucous membranes moist NECK: supple no meningeal signs SPINE:entire spine  nontender CV: S1/S2 noted, no murmurs/rubs/gallops noted LUNGS: Lungs are clear to auscultation bilaterally, no apparent distress ABDOMEN: soft,gravid, nontender, no rebound/guarding noted GU:no cva tenderness NEURO: Pt is awake/alert, moves all extremitiesx4 EXTREMITIES: pulses normal, full ROM SKIN: warm, color normal PSYCH: no abnormalities of mood noted  ED Course  Procedures    Labs Reviewed  URINALYSIS, ROUTINE W REFLEX MICROSCOPIC   6:20 PM Pt seen, placed on fetal monitoring per nursing Pt stable, abd soft at this time  6:41 PM FHT in 140s Pt has no complaints D/w dr Emelda Fear, OB Discussed that FHT is appropriate at 23 weeks He feels she is stable for d/c home without further workup required  6:59 PM Pt resting comfortably Reports some fetal movement No abd pain reported at this time BP 122/57  Pulse 78  Temp(Src) 98.7 F (37.1 C) (Oral)  Resp 20  Wt 170 lb (77.111 kg)  SpO2 10%  LMP 12/05/2010   Pt stable, no complaints, ambulatory, no further abd pain  The patient appears reasonably screened and/or stabilized for discharge and I doubt any other medical condition or other [facility] requiring further screening, evaluation, or treatment in the ED at this time prior to discharge.   MDM  Nursing notes reviewed and considered in documentation         Joya Gaskins, MD 05/31/11 2003

## 2011-05-31 NOTE — ED Notes (Signed)
Pt on fetal monitor, FHR 142.  [facility]  Dustin Flock RN

## 2011-05-31 NOTE — ED Notes (Signed)
Patient's pulse ox 97%. Sitting up to bedside. Denies needs. No distress. Call bell within reach.

## 2011-06-16 ENCOUNTER — Emergency Department (HOSPITAL_COMMUNITY)
Admission: EM | Admit: 2011-06-16 | Discharge: 2011-06-16 | Disposition: A | Discharge status: Home / Self Care | Payer: Self-pay | Attending: Emergency Medicine | Admitting: Emergency Medicine

## 2011-06-16 ENCOUNTER — Encounter (HOSPITAL_COMMUNITY): Payer: Self-pay

## 2011-06-16 DIAGNOSIS — F17200 Nicotine dependence, unspecified, uncomplicated: Secondary | ICD-10-CM | POA: Insufficient documentation

## 2011-06-16 DIAGNOSIS — Z86711 Personal history of pulmonary embolism: Secondary | ICD-10-CM | POA: Insufficient documentation

## 2011-06-16 DIAGNOSIS — R141 Gas pain: Secondary | ICD-10-CM | POA: Insufficient documentation

## 2011-06-16 DIAGNOSIS — Z86718 Personal history of other venous thrombosis and embolism: Secondary | ICD-10-CM | POA: Insufficient documentation

## 2011-06-16 DIAGNOSIS — K089 Disorder of teeth and supporting structures, unspecified: Secondary | ICD-10-CM | POA: Insufficient documentation

## 2011-06-16 DIAGNOSIS — K0889 Other specified disorders of teeth and supporting structures: Secondary | ICD-10-CM

## 2011-06-16 DIAGNOSIS — R142 Eructation: Secondary | ICD-10-CM | POA: Insufficient documentation

## 2011-06-16 DIAGNOSIS — O99891 Other specified diseases and conditions complicating pregnancy: Secondary | ICD-10-CM | POA: Insufficient documentation

## 2011-06-16 DIAGNOSIS — K219 Gastro-esophageal reflux disease without esophagitis: Secondary | ICD-10-CM | POA: Insufficient documentation

## 2011-06-16 DIAGNOSIS — K029 Dental caries, unspecified: Secondary | ICD-10-CM | POA: Insufficient documentation

## 2011-06-16 DIAGNOSIS — IMO0002 Reserved for concepts with insufficient information to code with codable children: Secondary | ICD-10-CM | POA: Insufficient documentation

## 2011-06-16 NOTE — ED Notes (Addendum)
Pt c/o bilateral dental pain, pt states that she has several teeth that are broken on left side,  Pain is noted to left and right side of jaw area, pt states that it started about a week and half ago, unable to get in to see her dentist,  When asked about gallbladder problems pt states that she is fine with her gallbladder and is here to be seen for her dental pain, pt denies any abd pain, discharge, FHT 148, pt is [redacted] weeks pregnant. No complications with pregnancy per pt,

## 2011-06-16 NOTE — ED Provider Notes (Signed)
History     CSN: [id number]  Arrival date & time 06/16/11  1425   First MD Initiated Contact with Patient 06/16/11 1442      Chief Complaint  Patient presents with  . Dental Pain  . Emesis    (Consider location/radiation/quality/duration/timing/severity/associated sxs/prior treatment) HPI Complains of diffuse toothache first approximately one week no fever patient also reports "my gallbladder is acting up". Reports that she's had epigastric pain radiating to her throat which is worse with lying down and improved with sitting up treated with Mylanta with relief patient exhibits a lot of belching. Last vomited one week ago no fever no other complaint. Patient presently 7 months pregnant. No nausea or vomiting for the past one week. Here today due to diffuse mouth pain. States he's unable to see her dentist due to lack of insurance. Treated with Tylenol without relief no other associated symptoms.no abdominal pain Past Medical History  Diagnosis Date  . Herniated disc   . DVT (deep venous thrombosis)   . Pulmonary embolus   currently pregnant normal pregnancy [facility]  History reviewed. No pertinent past surgical history.  Family History  Problem Relation Age of Onset  . Hyperlipidemia Mother   . Diabetes Father     History  Substance Use Topics  . Smoking status: Current Everyday Smoker    Types: Cigarettes  . Smokeless tobacco: Not on file  . Alcohol Use: No    OB History    Grav Para Term Preterm Abortions TAB SAB Ect Mult Living   7 6 6  0 0 0 0 0 0 6      Review of Systems  Constitutional: Negative.   HENT: Negative.        Toothache  Respiratory: Negative.   Cardiovascular: Negative.   Gastrointestinal: Negative.        Belching  Genitourinary:       Pregnant  Musculoskeletal: Negative.   Skin: Negative.   Neurological: Negative.   Hematological: Negative.   Psychiatric/Behavioral: Negative.     Allergies  Review of patient's  allergies indicates no known allergies.  Home Medications   Current Outpatient Rx  Name Route Sig Dispense Refill  . FLINTSTONES/EXTRA C PO CHEW Oral Chew 1 tablet by mouth every morning.      BP 118/50  Pulse 92  Temp 97.8 F (36.6 C)  Resp 16  Ht 5\' 8"  (1.727 m)  Wt 170 lb (77.111 kg)  BMI 25.85 kg/m2  SpO2 98%  LMP 12/05/2010  Physical Exam  Nursing note and vitals reviewed. Constitutional: She appears well-developed and well-nourished.  HENT:  Head: Normocephalic and atraumatic.       Multiple dental caries, no fluctuance of gingiva no trismus  Eyes: Conjunctivae are normal. Pupils are equal, round, and reactive to light.  Neck: Neck supple. No tracheal deviation present. No thyromegaly present.  Cardiovascular: Normal rate and regular rhythm.   No murmur heard. Pulmonary/Chest: Effort normal and breath sounds normal.  Abdominal: Soft. Bowel sounds are normal. She exhibits no distension. There is no tenderness.       Gravid fetal heart tones 148  Musculoskeletal: Normal range of motion. She exhibits no edema and no tenderness.  Lymphadenopathy:    She has no cervical adenopathy.  Neurological: She is alert. Coordination normal.  Skin: Skin is warm and dry. No rash noted.  Psychiatric: She has a normal mood and affect.    ED Course  Procedures (including critical care time)  Labs Reviewed -  No data to display No results found.   No diagnosis found.    MDM  No definite mouth abscess, patient has multiple dental caries which look to be long-standing GI symptoms not consistent with biliary colic. They're more consistent with GE reflux late in pregnancy Plan continue Mylanta Tylenol for dental pain Patient encouraged to stop smoking Patient given list of dental services Diagnosis #1 dental pain #2GERD #3 tobacco abuse        Doug Sou, MD 06/16/11 1521

## 2011-06-16 NOTE — ED Notes (Addendum)
Pt here with generalized toothache per pt for over a week.   Pt also now c/o "my gallbladder is messing up. I've been throwing up"

## 2011-09-02 ENCOUNTER — Observation Stay: Payer: Self-pay | Admitting: Obstetrics and Gynecology

## 2011-09-02 LAB — RAPID HIV-1/2 QL/CONFIRM: HIV-1/2,Rapid Ql: NEGATIVE

## 2011-09-02 LAB — DRUG SCREEN, URINE
Barbiturates, Ur Screen: NEGATIVE (ref ?–200)
Cannabinoid 50 Ng, Ur ~~LOC~~: NEGATIVE (ref ?–50)
MDMA (Ecstasy)Ur Screen: NEGATIVE (ref ?–500)
Methadone, Ur Screen: POSITIVE (ref ?–300)
Opiate, Ur Screen: NEGATIVE (ref ?–300)

## 2011-09-02 LAB — HEMATOCRIT: HCT: 30.4 % — ABNORMAL LOW (ref 35.0–47.0)

## 2011-09-02 LAB — HEMOGLOBIN: HGB: 9.5 g/dL — ABNORMAL LOW (ref 12.0–16.0)

## 2011-09-02 LAB — PLATELET COUNT: Platelet: 131 10*3/uL — ABNORMAL LOW (ref 150–440)

## 2011-09-05 ENCOUNTER — Inpatient Hospital Stay: Payer: Self-pay

## 2011-09-05 LAB — CBC WITH DIFFERENTIAL/PLATELET
Basophil #: 0.1 10*3/uL (ref 0.0–0.1)
Basophil %: 0.5 %
Eosinophil #: 0 10*3/uL (ref 0.0–0.7)
Lymphocyte #: 1.4 10*3/uL (ref 1.0–3.6)
Lymphocyte %: 13 %
MCH: 22.4 pg — ABNORMAL LOW (ref 26.0–34.0)
MCHC: 31.9 g/dL — ABNORMAL LOW (ref 32.0–36.0)
MCV: 70 fL — ABNORMAL LOW (ref 80–100)
Neutrophil %: 81.8 %
RDW: 17.7 % — ABNORMAL HIGH (ref 11.5–14.5)
WBC: 10.7 10*3/uL (ref 3.6–11.0)

## 2011-09-06 LAB — HEMOGLOBIN: HGB: 9.3 g/dL — ABNORMAL LOW (ref 12.0–16.0)

## 2011-09-08 ENCOUNTER — Emergency Department: Payer: Self-pay | Admitting: Emergency Medicine

## 2011-09-08 LAB — CBC WITH DIFFERENTIAL/PLATELET
Basophil #: 0 10*3/uL (ref 0.0–0.1)
Basophil %: 0.4 %
Eosinophil %: 2.2 %
MCH: 22.5 pg — ABNORMAL LOW (ref 26.0–34.0)
Neutrophil #: 8 10*3/uL — ABNORMAL HIGH (ref 1.4–6.5)
Neutrophil %: 76.5 %
Platelet: 224 10*3/uL (ref 150–440)
RDW: 17.7 % — ABNORMAL HIGH (ref 11.5–14.5)

## 2011-09-08 LAB — COMPREHENSIVE METABOLIC PANEL
Alkaline Phosphatase: 133 U/L (ref 50–136)
BUN: 9 mg/dL (ref 7–18)
Calcium, Total: 8.1 mg/dL — ABNORMAL LOW (ref 8.5–10.1)
Chloride: 107 mmol/L (ref 98–107)
EGFR (African American): >60
EGFR (Non-African Amer.): >60
Osmolality: 277 (ref 275–301)
Potassium: 3.8 mmol/L (ref 3.5–5.1)
Sodium: 140 mmol/L (ref 136–145)

## 2011-10-26 ENCOUNTER — Encounter (HOSPITAL_COMMUNITY): Payer: Self-pay | Admitting: Emergency Medicine

## 2011-10-26 ENCOUNTER — Emergency Department (HOSPITAL_COMMUNITY)
Admission: EM | Admit: 2011-10-26 | Discharge: 2011-10-26 | Disposition: A | Discharge status: Home / Self Care | Payer: Medicaid Other | Attending: Physician Assistant | Admitting: Physician Assistant

## 2011-10-26 DIAGNOSIS — Z86718 Personal history of other venous thrombosis and embolism: Secondary | ICD-10-CM | POA: Insufficient documentation

## 2011-10-26 DIAGNOSIS — K029 Dental caries, unspecified: Secondary | ICD-10-CM | POA: Insufficient documentation

## 2011-10-26 DIAGNOSIS — Z86711 Personal history of pulmonary embolism: Secondary | ICD-10-CM | POA: Insufficient documentation

## 2011-10-26 DIAGNOSIS — K0889 Other specified disorders of teeth and supporting structures: Secondary | ICD-10-CM

## 2011-10-26 DIAGNOSIS — F17200 Nicotine dependence, unspecified, uncomplicated: Secondary | ICD-10-CM | POA: Insufficient documentation

## 2011-10-26 MED ORDER — MELOXICAM 7.5 MG PO TABS: ORAL_TABLET | ORAL | Status: DC

## 2011-10-26 MED ORDER — HYDROCODONE-ACETAMINOPHEN 5-325 MG PO TABS: 1.0000 | ORAL_TABLET | ORAL | Status: AC | PRN

## 2011-10-26 MED ORDER — AMOXICILLIN 500 MG PO CAPS: ORAL_CAPSULE | ORAL | Status: DC

## 2011-10-26 NOTE — ED Notes (Signed)
Pt given list of dental providers in area. Lives in [place].

## 2011-10-26 NOTE — ED Provider Notes (Signed)
History     CSN: [id number]  Arrival date & time 10/26/11  1101   None     Chief Complaint  Patient presents with  . Dental Pain    (Consider location/radiation/quality/duration/timing/severity/associated sxs/prior treatment) Patient is a 31 y.o. female presenting with tooth pain. The history is provided by the patient.  Dental PainThe primary symptoms include mouth pain. Primary symptoms do not include shortness of breath or cough. The symptoms began more than 1 week ago. The symptoms are worsening. The symptoms are new. The symptoms occur constantly.  Additional symptoms include: gum swelling, gum tenderness and jaw pain. Additional symptoms do not include: nosebleeds. Medical issues include: smoking.    Past Medical History  Diagnosis Date  . Herniated disc   . DVT (deep venous thrombosis)   . Pulmonary embolus     History reviewed. No pertinent past surgical history.  Family History  Problem Relation Age of Onset  . Hyperlipidemia Mother   . Diabetes Father     History  Substance Use Topics  . Smoking status: Current Everyday Smoker -- 2.0 packs/day for 12 years    Types: Cigarettes  . Smokeless tobacco: Never Used  . Alcohol Use: No    OB History    Grav Para Term Preterm Abortions TAB SAB Ect Mult Living   7 6 6  0 0 0 0 0 0 7      Review of Systems  Constitutional: Negative for activity change.       All ROS Neg except as noted in HPI  HENT: Positive for dental problem. Negative for nosebleeds and neck pain.   Eyes: Negative for photophobia and discharge.  Respiratory: Negative for cough, shortness of breath and wheezing.   Cardiovascular: Negative for chest pain and palpitations.  Gastrointestinal: Negative for abdominal pain and blood in stool.  Genitourinary: Negative for dysuria, frequency and hematuria.  Musculoskeletal: Positive for back pain. Negative for arthralgias.  Skin: Negative.   Neurological: Negative for dizziness, seizures and speech  difficulty.  Psychiatric/Behavioral: Negative for hallucinations and confusion.    Allergies  Review of patient's allergies indicates no known allergies.  Home Medications   Current Outpatient Rx  Name Route Sig Dispense Refill  . ACETAMINOPHEN 325 MG PO TABS Oral Take 650 mg by mouth every 6 (six) hours as needed. For pain    . IBUPROFEN 200 MG PO TABS Oral Take 200 mg by mouth every 6 (six) hours as needed. For pain      BP 121/69  Pulse 93  Temp 98.3 F (36.8 C) (Oral)  Resp 12  Ht 5\' 8"  (1.727 m)  Wt 150 lb (68.04 kg)  BMI 22.81 kg/m2  SpO2 99%  LMP 10/26/2011  Breastfeeding? Unknown  Physical Exam  Nursing note and vitals reviewed. Constitutional: She is oriented to person, place, and time. She appears well-developed and well-nourished.  Non-toxic appearance.  HENT:  Head: Normocephalic.  Right Ear: Tympanic membrane and external ear normal.  Left Ear: Tympanic membrane and external ear normal.  Mouth/Throat: Dental caries present. No dental abscesses or uvula swelling.  Eyes: EOM and lids are normal. Pupils are equal, round, and reactive to light.  Neck: Normal range of motion. Neck supple. Carotid bruit is not present.  Cardiovascular: Normal rate, regular rhythm, normal heart sounds, intact distal pulses and normal pulses.   Pulmonary/Chest: Breath sounds normal. No respiratory distress.  Abdominal: Soft. Bowel sounds are normal. There is no tenderness. There is no guarding.  Musculoskeletal: Normal range of  motion.  Lymphadenopathy:       Head (right side): No submandibular adenopathy present.       Head (left side): No submandibular adenopathy present.    She has no cervical adenopathy.  Neurological: She is alert and oriented to person, place, and time. She has normal strength. No cranial nerve deficit or sensory deficit.  Skin: Skin is warm and dry.  Psychiatric: She has a normal mood and affect. Her speech is normal.    ED Course  Procedures (including  critical care time)  Labs Reviewed - No data to display No results found.   No diagnosis found.    MDM  I have reviewed nursing notes, vital signs, and all appropriate lab and imaging results for this patient. Dental resourses given to the patient. Rx for amoxil and norco given.      Kathie Dike, [place] 10/26/11 2151

## 2011-10-26 NOTE — ED Notes (Signed)
Pt c/o L lower jaw dental pain. 

## 2011-10-26 NOTE — Discharge Instructions (Signed)
Dental Pain  A tooth ache may be caused by cavities (tooth decay). Cavities expose the nerve of the tooth to air and hot or cold temperatures. It may come from an infection or abscess (also called a boil or furuncle) around your tooth. It is also often caused by dental caries (tooth decay). This causes the pain you are having.  DIAGNOSIS   Your caregiver can diagnose this problem by exam.  TREATMENT   · If caused by an infection, it may be treated with medications which kill germs (antibiotics) and pain medications as prescribed by your caregiver. Take medications as directed.  · Only take over-the-counter or prescription medicines for pain, discomfort, or fever as directed by your caregiver.  · Whether the tooth ache today is caused by infection or dental disease, you should see your dentist as soon as possible for further care.  SEEK MEDICAL CARE IF:  The exam and treatment you received today has been provided on an emergency basis only. This is not a substitute for complete medical or dental care. If your problem worsens or new problems (symptoms) appear, and you are unable to meet with your dentist, call or return to this location.  SEEK IMMEDIATE MEDICAL CARE IF:   · You have a fever.  · You develop redness and swelling of your face, jaw, or neck.  · You are unable to open your mouth.  · You have severe pain uncontrolled by pain medicine.  MAKE SURE YOU:   · Understand these instructions.  · Will watch your condition.  · Will get help right away if you are not doing well or get worse.  Document Released: 05/01/2005 Document Revised: 04/20/2011 Document Reviewed: 12/18/2007  [employer]® Patient Information ©2012 [employer].

## 2011-11-07 NOTE — ED Provider Notes (Signed)
Medical screening examination/treatment/procedure(s) were performed by non-physician practitioner and as supervising physician I was immediately available for consultation/collaboration.   Janese Radabaugh L Yvanna Vidas, MD 11/07/11 0730 

## 2011-12-12 ENCOUNTER — Other Ambulatory Visit (HOSPITAL_COMMUNITY): Payer: Self-pay | Admitting: Family Medicine

## 2011-12-12 DIAGNOSIS — M545 Low back pain: Secondary | ICD-10-CM

## 2011-12-18 ENCOUNTER — Ambulatory Visit (HOSPITAL_COMMUNITY): Payer: Medicaid Other

## 2012-01-01 ENCOUNTER — Ambulatory Visit (HOSPITAL_COMMUNITY)
Admission: RE | Admit: 2012-01-01 | Discharge: 2012-01-01 | Disposition: A | Discharge status: Home / Self Care | Payer: Medicaid Other | Source: Ambulatory Visit | Attending: Family Medicine | Admitting: Family Medicine

## 2012-01-01 DIAGNOSIS — M545 Low back pain, unspecified: Secondary | ICD-10-CM | POA: Insufficient documentation

## 2012-01-01 DIAGNOSIS — M5126 Other intervertebral disc displacement, lumbar region: Secondary | ICD-10-CM | POA: Insufficient documentation

## 2013-07-02 ENCOUNTER — Encounter (HOSPITAL_COMMUNITY): Payer: Self-pay | Admitting: Emergency Medicine

## 2013-07-02 ENCOUNTER — Emergency Department (HOSPITAL_COMMUNITY)
Admission: EM | Admit: 2013-07-02 | Discharge: 2013-07-02 | Discharge status: Against Medical Advice | Payer: Medicaid Other | Attending: Emergency Medicine | Admitting: Emergency Medicine

## 2013-07-02 DIAGNOSIS — R109 Unspecified abdominal pain: Secondary | ICD-10-CM | POA: Insufficient documentation

## 2013-07-02 DIAGNOSIS — F17200 Nicotine dependence, unspecified, uncomplicated: Secondary | ICD-10-CM | POA: Insufficient documentation

## 2013-07-02 NOTE — ED Notes (Signed)
Pt not in waiting room

## 2013-07-02 NOTE — ED Notes (Signed)
Pt c/o pain in r flank x 3 days that radiates into abd.  Denies any urinary symptoms but has been taking azo since yesterday.  Denies n/v/d.  Denies history of kidney stones.

## 2013-07-02 NOTE — ED Notes (Signed)
Urine was not collected, clicked off in anticipation of getting pt and getting paperwork ready to send.  EF RN

## 2013-07-02 NOTE — ED Notes (Signed)
Pt not in waiting room x 3  

## 2014-03-16 ENCOUNTER — Encounter (HOSPITAL_COMMUNITY): Payer: Self-pay | Admitting: Emergency Medicine

## 2014-06-02 ENCOUNTER — Encounter (HOSPITAL_COMMUNITY): Payer: Self-pay | Admitting: Emergency Medicine

## 2014-06-02 ENCOUNTER — Emergency Department (HOSPITAL_COMMUNITY)
Admission: EM | Admit: 2014-06-02 | Discharge: 2014-06-02 | Disposition: A | Discharge status: Home / Self Care | Payer: Medicaid Other | Attending: Emergency Medicine | Admitting: Emergency Medicine

## 2014-06-02 DIAGNOSIS — Z86718 Personal history of other venous thrombosis and embolism: Secondary | ICD-10-CM | POA: Insufficient documentation

## 2014-06-02 DIAGNOSIS — Z3A08 8 weeks gestation of pregnancy: Secondary | ICD-10-CM | POA: Diagnosis not present

## 2014-06-02 DIAGNOSIS — O210 Mild hyperemesis gravidarum: Secondary | ICD-10-CM | POA: Diagnosis present

## 2014-06-02 DIAGNOSIS — Z86711 Personal history of pulmonary embolism: Secondary | ICD-10-CM | POA: Insufficient documentation

## 2014-06-02 DIAGNOSIS — Z792 Long term (current) use of antibiotics: Secondary | ICD-10-CM | POA: Diagnosis not present

## 2014-06-02 DIAGNOSIS — O2341 Unspecified infection of urinary tract in pregnancy, first trimester: Secondary | ICD-10-CM | POA: Diagnosis not present

## 2014-06-02 DIAGNOSIS — O99331 Smoking (tobacco) complicating pregnancy, first trimester: Secondary | ICD-10-CM | POA: Insufficient documentation

## 2014-06-02 DIAGNOSIS — Z8739 Personal history of other diseases of the musculoskeletal system and connective tissue: Secondary | ICD-10-CM | POA: Diagnosis not present

## 2014-06-02 DIAGNOSIS — F17210 Nicotine dependence, cigarettes, uncomplicated: Secondary | ICD-10-CM | POA: Insufficient documentation

## 2014-06-02 DIAGNOSIS — N390 Urinary tract infection, site not specified: Secondary | ICD-10-CM

## 2014-06-02 LAB — BASIC METABOLIC PANEL
ANION GAP: 11 (ref 5–15)
BUN: 7 mg/dL (ref 6–23)
CHLORIDE: 98 meq/L (ref 96–112)
CO2: 25 mmol/L (ref 19–32)
CREATININE: 0.76 mg/dL (ref 0.50–1.10)
Calcium: 9.6 mg/dL (ref 8.4–10.5)
GFR calc Af Amer: >90 mL/min (ref >90)
GLUCOSE: 151 mg/dL — AB (ref 70–99)
Potassium: 3.3 mmol/L — ABNORMAL LOW (ref 3.5–5.1)
Sodium: 134 mmol/L — ABNORMAL LOW (ref 135–145)

## 2014-06-02 LAB — CBC WITH DIFFERENTIAL/PLATELET
BASOS ABS: 0 10*3/uL (ref 0.0–0.1)
Basophils Relative: 0 % (ref 0–1)
EOS ABS: 0 10*3/uL (ref 0.0–0.7)
Eosinophils Relative: 0 % (ref 0–5)
HEMATOCRIT: 41.3 % (ref 36.0–46.0)
Hemoglobin: 13.7 g/dL (ref 12.0–15.0)
LYMPHS ABS: 0.8 10*3/uL (ref 0.7–4.0)
LYMPHS PCT: 7 % — AB (ref 12–46)
MCH: 28.1 pg (ref 26.0–34.0)
MCHC: 33.2 g/dL (ref 30.0–36.0)
MCV: 84.6 fL (ref 78.0–100.0)
MONO ABS: 0.8 10*3/uL (ref 0.1–1.0)
MONOS PCT: 7 % (ref 3–12)
NEUTROS ABS: 9.5 10*3/uL — AB (ref 1.7–7.7)
Neutrophils Relative %: 86 % — ABNORMAL HIGH (ref 43–77)
PLATELETS: 206 10*3/uL (ref 150–400)
RBC: 4.88 MIL/uL (ref 3.87–5.11)
RDW: 13.5 % (ref 11.5–15.5)
WBC: 11 10*3/uL — ABNORMAL HIGH (ref 4.0–10.5)

## 2014-06-02 LAB — URINALYSIS, ROUTINE W REFLEX MICROSCOPIC
GLUCOSE, UA: 250 mg/dL — AB
KETONES UR: 15 mg/dL — AB
Nitrite: POSITIVE — AB
PH: 5 (ref 5.0–8.0)
SPECIFIC GRAVITY, URINE: 1.02 (ref 1.005–1.030)

## 2014-06-02 LAB — URINE MICROSCOPIC-ADD ON

## 2014-06-02 LAB — PREGNANCY, URINE: PREG TEST UR: POSITIVE — AB

## 2014-06-02 MED ORDER — ONDANSETRON HCL 4 MG/2ML IJ SOLN
INTRAMUSCULAR | Status: AC
  Filled 2014-06-02: qty 2

## 2014-06-02 MED ORDER — SODIUM CHLORIDE 0.9 % IV BOLUS (SEPSIS)
1000.0000 mL | Freq: Once | INTRAVENOUS | Status: AC
Start: 2014-06-02 — End: 2014-06-02
  Administered 2014-06-02: 1000 mL via INTRAVENOUS

## 2014-06-02 MED ORDER — ONDANSETRON 4 MG PO TBDP: ORAL_TABLET | ORAL | Status: DC

## 2014-06-02 MED ORDER — ONDANSETRON HCL 4 MG/2ML IJ SOLN
4.0000 mg | Freq: Once | INTRAMUSCULAR | Status: AC
  Administered 2014-06-02: 4 mg via INTRAMUSCULAR

## 2014-06-02 MED ORDER — SODIUM CHLORIDE 0.9 % IV BOLUS (SEPSIS)
1000.0000 mL | Freq: Once | INTRAVENOUS | Status: AC
  Administered 2014-06-02: 1000 mL via INTRAVENOUS

## 2014-06-02 MED ORDER — DEXTROSE 5 % IV SOLN
1.0000 g | Freq: Once | INTRAVENOUS | Status: AC
  Administered 2014-06-02: 1 g via INTRAVENOUS
  Filled 2014-06-02: qty 10

## 2014-06-02 MED ORDER — ONDANSETRON HCL 4 MG/2ML IJ SOLN
4.0000 mg | Freq: Once | INTRAMUSCULAR | Status: AC
  Administered 2014-06-02: 4 mg via INTRAVENOUS
  Filled 2014-06-02: qty 2

## 2014-06-02 MED ORDER — CEPHALEXIN 500 MG PO CAPS: 500.0000 mg | ORAL_CAPSULE | Freq: Four times a day (QID) | ORAL | Status: DC

## 2014-06-02 NOTE — Discharge Instructions (Signed)
Drink plenty of fluids and follow up with your md next week for recheck 

## 2014-06-02 NOTE — ED Notes (Signed)
Patient ambulatory to restroom with steady gait, clean catch instructions given and advised pt to bring specimen back to room as well.  

## 2014-06-02 NOTE — ED Notes (Addendum)
Patient c/o vomiting since Sunday. Denies any diarrhea, fevers, or urinary symptoms. Per patient abd cramping from vomiting. Patient reports being "a little over" 2 months pregnant.

## 2014-06-02 NOTE — ED Provider Notes (Signed)
CSN: [id number]     Arrival date & time 06/02/14  1619 History   First MD Initiated Contact with Patient 06/02/14 1632     This chart was scribed for Ann Diego, MD by Ann Hoover, ED Scribe. This patient was seen in room [facility] and the patient's care was started 4:35 PM.   Chief Complaint  Patient presents with  . Emesis During Pregnancy   Patient is a 34 y.o. female presenting with vomiting. The history is provided by the patient. No language interpreter was used.  Emesis Severity:  Moderate Duration:  2 days Timing:  Intermittent Number of daily episodes:  Numerous  Progression:  Unchanged Chronicity:  New Context: not post-tussive   Relieved by:  Nothing Associated symptoms: no chills, no diarrhea and no fever   Risk factors: pregnant now     HPI Comments: Ann Hoover is a 34 y.o. female with a PMHx of DVT and pulmonary embolus who presents to the Emergency Department complaining of ongoing, intermittent vomiting x 2 days that is unchagned. Pt has tried OTC AZO for symptoms without any improvement. Pt is currently pregnant but is unsure of current gestational stage. Positive pregnancy test recently confirmed, appointment scheduled next week for OB/GYN follow up. LNMP beginning of November 2015. [name] denies any fever, chills, diarrhea, or other associated symptoms at this time. No known allergies to medications.   Past Medical History  Diagnosis Date  . Herniated disc   . DVT (deep venous thrombosis)   . Pulmonary embolus    History reviewed. No pertinent past surgical history. Family History  Problem Relation Age of Onset  . Hyperlipidemia Mother   . Diabetes Father    History  Substance Use Topics  . Smoking status: Current Every Day Smoker -- 2.00 packs/day for 12 years    Types: Cigarettes  . Smokeless tobacco: Never Used  . Alcohol Use: No   OB History    Gravida Para Term Preterm AB TAB SAB Ectopic Multiple Living   8 7 7  0 0 0 0 0 0 7      Review of Systems  Constitutional: Negative for fever and chills.  Gastrointestinal: Positive for nausea and vomiting. Negative for diarrhea.  All other systems reviewed and are negative.     Allergies  Review of patient's allergies indicates no known allergies.  Home Medications   Prior to Admission medications   Medication Sig Start Date End Date Taking? Authorizing Provider  acetaminophen (TYLENOL) 325 MG tablet Take 650 mg by mouth every 6 (six) hours as needed. For pain    Historical Provider, MD  amoxicillin (AMOXIL) 500 MG capsule 2 po bid with food 10/26/11   Ann Ahr, PA-C  ibuprofen (ADVIL,MOTRIN) 200 MG tablet Take 200 mg by mouth every 6 (six) hours as needed. For pain    Historical Provider, MD  meloxicam (MOBIC) 7.5 MG tablet 1 po bid with food 10/26/11   Ann Ahr, PA-C   Triage Vitals: BP 115/66 mmHg  Pulse 119  Temp(Src) 98.3 F (36.8 C) (Oral)  Resp 18  Ht 5\' 8"  (1.727 m)  Wt 160 lb (72.576 kg)  BMI 24.33 kg/m2  SpO2 100%  LMP 03/15/2014   Physical Exam  Constitutional: She is oriented to person, place, and time. She appears well-developed.  HENT:  Head: Normocephalic.  Dry mucous membranes  Eyes: Conjunctivae and EOM are normal. No scleral icterus.  Neck: Neck supple. No thyromegaly present.  Cardiovascular: Normal rate and  regular rhythm.  Exam reveals no gallop and no friction rub.   No murmur heard. Pulmonary/Chest: No stridor. She has no wheezes. She has no rales. She exhibits no tenderness.  Abdominal: She exhibits no distension. There is no tenderness. There is no rebound.  Musculoskeletal: Normal range of motion. She exhibits no edema.  Lymphadenopathy:    She has no cervical adenopathy.  Neurological: She is oriented to person, place, and time. She exhibits normal muscle tone. Coordination normal.  Skin: No rash noted. No erythema.  Psychiatric: She has a normal mood and affect. Her behavior is normal.    ED Course   Procedures (including critical care time)  DIAGNOSTIC STUDIES: Oxygen Saturation is 100% on RA, Normal by my interpretation.    COORDINATION OF CARE: 4:42 PM- Will give fluids. Will order urinalysis, CBC, BMP, and pregnancy urine. Discussed treatment plan with pt at bedside and pt agreed to plan.     Labs Review Labs Reviewed - No data to display  Imaging Review No results found.   EKG Interpretation None      MDM   Final diagnoses:  None    Uti,   tx with keflex  I personally performed the services described in this documentation, which was scribed in my presence. The recorded information has been reviewed and is accurate.    Ann Diego, MD 06/02/14 1950

## 2014-06-05 LAB — URINE CULTURE: Colony Count: >=100000

## 2014-06-08 ENCOUNTER — Telehealth (HOSPITAL_COMMUNITY): Payer: Self-pay

## 2014-06-08 NOTE — Telephone Encounter (Signed)
Post ED Visit - Positive Culture Follow-up  Culture report reviewed by antimicrobial stewardship pharmacist: []  Wes Hamlin, Pharm.D., BCPS []  Heide Guile, Pharm.D., BCPS []  Alycia Rossetti, Pharm.D., BCPS []  [place], Pharm.D., BCPS, AAHIVP []  Legrand Como, Pharm.D., BCPS, AAHIVP []  Isac Sarna, Pharm.D., BCPS X  Sherlon Handing, Pharm MD  Positive Urine culture, >/= 100,000 colonies -> E Coli Treated with Cephalexin, organism sensitive to the same and no further patient follow-up is required at this time.   Dortha Kern 06/08/2014, 6:07 AM

## 2014-09-28 ENCOUNTER — Encounter (HOSPITAL_COMMUNITY): Payer: Self-pay | Admitting: *Deleted

## 2014-09-28 DIAGNOSIS — O9989 Other specified diseases and conditions complicating pregnancy, childbirth and the puerperium: Secondary | ICD-10-CM | POA: Insufficient documentation

## 2014-09-28 DIAGNOSIS — Z3A33 33 weeks gestation of pregnancy: Secondary | ICD-10-CM | POA: Diagnosis not present

## 2014-09-28 DIAGNOSIS — O99333 Smoking (tobacco) complicating pregnancy, third trimester: Secondary | ICD-10-CM | POA: Insufficient documentation

## 2014-09-28 DIAGNOSIS — M549 Dorsalgia, unspecified: Secondary | ICD-10-CM | POA: Diagnosis not present

## 2014-09-28 DIAGNOSIS — F17219 Nicotine dependence, cigarettes, with unspecified nicotine-induced disorders: Secondary | ICD-10-CM | POA: Diagnosis not present

## 2014-09-28 NOTE — ED Notes (Signed)
Pt c/o lower back pain x 2 weeks; pt is [redacted] weeks pregnant

## 2014-09-29 ENCOUNTER — Emergency Department (HOSPITAL_COMMUNITY)
Admission: EM | Admit: 2014-09-29 | Discharge: 2014-09-29 | Discharge status: Against Medical Advice | Payer: Medicaid Other | Attending: Emergency Medicine | Admitting: Emergency Medicine

## 2014-09-29 NOTE — ED Notes (Signed)
Pt called no answer 

## 2014-11-01 ENCOUNTER — Encounter (HOSPITAL_COMMUNITY): Payer: Self-pay | Admitting: Emergency Medicine

## 2014-11-01 ENCOUNTER — Emergency Department (HOSPITAL_COMMUNITY)
Admission: EM | Admit: 2014-11-01 | Discharge: 2014-11-01 | Disposition: A | Discharge status: Home / Self Care | Payer: Medicaid Other | Attending: Emergency Medicine | Admitting: Emergency Medicine

## 2014-11-01 DIAGNOSIS — R000 Tachycardia, unspecified: Secondary | ICD-10-CM | POA: Insufficient documentation

## 2014-11-01 DIAGNOSIS — O2343 Unspecified infection of urinary tract in pregnancy, third trimester: Secondary | ICD-10-CM | POA: Diagnosis present

## 2014-11-01 DIAGNOSIS — R531 Weakness: Secondary | ICD-10-CM

## 2014-11-01 DIAGNOSIS — O99333 Smoking (tobacco) complicating pregnancy, third trimester: Secondary | ICD-10-CM | POA: Insufficient documentation

## 2014-11-01 DIAGNOSIS — Z3490 Encounter for supervision of normal pregnancy, unspecified, unspecified trimester: Secondary | ICD-10-CM

## 2014-11-01 DIAGNOSIS — Z79899 Other long term (current) drug therapy: Secondary | ICD-10-CM | POA: Diagnosis not present

## 2014-11-01 DIAGNOSIS — Z86718 Personal history of other venous thrombosis and embolism: Secondary | ICD-10-CM | POA: Insufficient documentation

## 2014-11-01 DIAGNOSIS — F17210 Nicotine dependence, cigarettes, uncomplicated: Secondary | ICD-10-CM | POA: Diagnosis not present

## 2014-11-01 DIAGNOSIS — N390 Urinary tract infection, site not specified: Secondary | ICD-10-CM

## 2014-11-01 DIAGNOSIS — Z86711 Personal history of pulmonary embolism: Secondary | ICD-10-CM | POA: Insufficient documentation

## 2014-11-01 DIAGNOSIS — Z3A38 38 weeks gestation of pregnancy: Secondary | ICD-10-CM | POA: Diagnosis not present

## 2014-11-01 LAB — CBC WITH DIFFERENTIAL/PLATELET
BASOS ABS: 0 10*3/uL (ref 0.0–0.1)
Basophils Relative: 0 % (ref 0–1)
EOS PCT: 0 % (ref 0–5)
Eosinophils Absolute: 0 10*3/uL (ref 0.0–0.7)
HEMATOCRIT: 33 % — AB (ref 36.0–46.0)
Hemoglobin: 10.2 g/dL — ABNORMAL LOW (ref 12.0–15.0)
Lymphocytes Relative: 11 % — ABNORMAL LOW (ref 12–46)
Lymphs Abs: 0.9 10*3/uL (ref 0.7–4.0)
MCH: 23.3 pg — ABNORMAL LOW (ref 26.0–34.0)
MCHC: 30.9 g/dL (ref 30.0–36.0)
MCV: 75.3 fL — AB (ref 78.0–100.0)
Monocytes Absolute: 0.5 10*3/uL (ref 0.1–1.0)
Monocytes Relative: 6 % (ref 3–12)
Neutro Abs: 6.8 10*3/uL (ref 1.7–7.7)
Neutrophils Relative %: 83 % — ABNORMAL HIGH (ref 43–77)
PLATELETS: 126 10*3/uL — AB (ref 150–400)
RBC: 4.38 MIL/uL (ref 3.87–5.11)
RDW: 15.3 % (ref 11.5–15.5)
WBC: 8.3 10*3/uL (ref 4.0–10.5)

## 2014-11-01 LAB — URINALYSIS, ROUTINE W REFLEX MICROSCOPIC
GLUCOSE, UA: NEGATIVE mg/dL
Ketones, ur: NEGATIVE mg/dL
Nitrite: NEGATIVE
PROTEIN: 100 mg/dL — AB
SPECIFIC GRAVITY, URINE: 1.025 (ref 1.005–1.030)
Urobilinogen, UA: 4 mg/dL — ABNORMAL HIGH (ref 0.0–1.0)
pH: 6 (ref 5.0–8.0)

## 2014-11-01 LAB — COMPREHENSIVE METABOLIC PANEL
ALBUMIN: 2.2 g/dL — AB (ref 3.5–5.0)
ALK PHOS: 174 U/L — AB (ref 38–126)
ALT: 21 U/L (ref 14–54)
AST: 18 U/L (ref 15–41)
Anion gap: 10 (ref 5–15)
BUN: 9 mg/dL (ref 6–20)
CALCIUM: 8.6 mg/dL — AB (ref 8.9–10.3)
CHLORIDE: 101 mmol/L (ref 101–111)
CO2: 22 mmol/L (ref 22–32)
CREATININE: 0.81 mg/dL (ref 0.44–1.00)
GFR calc Af Amer: >60 mL/min (ref >60)
GLUCOSE: 118 mg/dL — AB (ref 65–99)
POTASSIUM: 3.7 mmol/L (ref 3.5–5.1)
Sodium: 133 mmol/L — ABNORMAL LOW (ref 135–145)
TOTAL PROTEIN: 6.9 g/dL (ref 6.5–8.1)
Total Bilirubin: 0.7 mg/dL (ref 0.3–1.2)

## 2014-11-01 LAB — URINE MICROSCOPIC-ADD ON

## 2014-11-01 LAB — PROTEIN / CREATININE RATIO, URINE
CREATININE, URINE: 229.21 mg/dL
PROTEIN CREATININE RATIO: 0.53 mg/mg{creat} — AB (ref 0.00–0.15)
Total Protein, Urine: 122 mg/dL

## 2014-11-01 MED ORDER — SODIUM CHLORIDE 0.9 % IV BOLUS (SEPSIS)
1000.0000 mL | Freq: Once | INTRAVENOUS | Status: AC
  Administered 2014-11-01: 1000 mL via INTRAVENOUS

## 2014-11-01 MED ORDER — ALUM & MAG HYDROXIDE-SIMETH 200-200-20 MG/5ML PO SUSP
30.0000 mL | Freq: Once | ORAL | Status: AC
  Administered 2014-11-01: 30 mL via ORAL
  Filled 2014-11-01: qty 30

## 2014-11-01 MED ORDER — CEPHALEXIN 500 MG PO CAPS: 500.0000 mg | ORAL_CAPSULE | Freq: Four times a day (QID) | ORAL | Status: DC

## 2014-11-01 MED ORDER — DEXTROSE 5 % IV SOLN
1.0000 g | Freq: Once | INTRAVENOUS | Status: AC
  Administered 2014-11-01: 1 g via INTRAVENOUS
  Filled 2014-11-01: qty 10

## 2014-11-01 NOTE — ED Notes (Signed)
Pt c/o migraine and weakness x 1 week. "Very little" vomiting. Pt is approx [redacted] weeks pregnant.

## 2014-11-01 NOTE — Progress Notes (Signed)
Spoke with Patent examiner. Says they are waiting on pt's urinalysis before they d/c her. The FHR is not reactive at this time. Pt cannot be d/c until the tracing is reactive.

## 2014-11-01 NOTE — Progress Notes (Signed)
Called [facility] ED to speak with Patent examiner. She is not advailable at this time and will call me back.

## 2014-11-01 NOTE — ED Notes (Signed)
Pt placed on TOCO. Rapid response nurse notified. nad noted.

## 2014-11-01 NOTE — Progress Notes (Signed)
Spoke with Patent examiner. FHR is intermittent. Cardio needs to be adj.

## 2014-11-01 NOTE — Progress Notes (Signed)
Dr. Harolyn Rutherford reveiwed FHR tracing. Okay for pt to be d/c home and to follow up with her OB.

## 2014-11-01 NOTE — Progress Notes (Signed)
Spoke with Dr. Harolyn Rutherford. Pt is a G8P7, all vaginal deliveries. No vaginal bleeding or leaking of fluid. Pt presents with c/o migraine headache and generalized weakness. Dr. Harolyn Rutherford reveiwed tracing. Notified of blood pressures. Says pt is okay to be dc'd home after they have finished their testing. Pt is getting IVF, Labs, EKG for c/o chest pain. Pt is to follow up with her OB. She receives her care in [place] and is a pt of Dr. Derenda Mis.

## 2014-11-01 NOTE — Progress Notes (Signed)
Received call from [facility] ED. Pt is a G8P7 at 28 5/[redacted] weeks gestation with c/o migraine headache and generalized weakness. No vaginal bleeding or leaking of fluid. No problems with blood pressures this pregnancy.

## 2014-11-01 NOTE — Progress Notes (Signed)
Spoke with Patent examiner. FHR tracing is intermittent. The cardio needs to be adjusted.

## 2014-11-01 NOTE — Progress Notes (Signed)
Spoke with Patent examiner. Pt's FHR tracing is intermittent. Cardio needs to be adjusted.

## 2014-11-01 NOTE — ED Notes (Signed)
Patient cleared from [facility] monitoring per [facility] at [facility]

## 2014-11-01 NOTE — Progress Notes (Signed)
Spoke with Patent examiner. Pt can be taken off the efm and dc'd home. She is to follow up with her OB. Dr. Harolyn Rutherford reveiwed the FHR tracing.

## 2014-11-01 NOTE — ED Provider Notes (Signed)
CSN: [id number]     Arrival date & time 11/01/14  1227 History   First MD Initiated Contact with Patient 11/01/14 1300     Chief Complaint  Patient presents with  . Migraine     (Consider location/radiation/quality/duration/timing/severity/associated sxs/prior Treatment) HPI Comments: Patient presents with 1 week history of generalized weakness, fatigue and lightheadedness. Symptoms are persistent for 1 week. She feels short of breath when she ambulates but denies any chest pain, cough or fever. She is [redacted] weeks pregnant G8 P7. occasionally with this pregnancy. She denies any abdominal pain, contractions, vaginal bleeding or fluid. Her OB doctors is Dr. Barrie Dunker in [place]. No previous problems with her other pregnancies. No history of hypertension or diabetes. She is on methadone chronically for chronic back pain. She is estimated doses. She endorses good by mouth intake and urine output. No fevers. No urinary symptoms. She felt extreme fatigue while at the store today and was encouraged to come in by her mother. She also endorses gradual onset of diffuse headache over the past 1 week as well. No focal weakness, numbness or tingling.  No thunderclap onset. No fevers or vomiting. 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  The history is provided by the patient.    Past Medical History  Diagnosis Date  . Herniated disc   . DVT (deep venous thrombosis)   . Pulmonary embolus    History reviewed. No pertinent past surgical history. Family History  Problem Relation Age of Onset  . Hyperlipidemia Mother   . Diabetes Father    History  Substance Use Topics  . Smoking status: Current Every Day Smoker -- 1.00 packs/day for 12 years    Types: Cigarettes  . Smokeless tobacco: Never Used  . Alcohol Use: No   OB History    Gravida Para Term Preterm AB TAB SAB Ectopic Multiple Living   8 7 7  0 0 0 0 0 0 7     Review of Systems  Constitutional: Positive for activity change, appetite change and fatigue.  Negative for fever.  HENT: Negative for congestion and rhinorrhea.   Respiratory: Positive for shortness of breath. Negative for cough and chest tightness.   Cardiovascular: Negative for chest pain.  Gastrointestinal: Negative for nausea, vomiting and abdominal pain.  Genitourinary: Negative for dysuria, vaginal bleeding and vaginal discharge.  Musculoskeletal: Negative for myalgias and arthralgias.  Skin: Negative for wound.  Neurological: Positive for weakness and headaches. Negative for dizziness and light-headedness.  A complete 10 system review of systems was obtained and all systems are negative except as noted in the HPI and PMH.      Allergies  Review of patient's allergies indicates no known allergies.  Home Medications   Prior to Admission medications   Medication Sig Start Date End Date Taking? Authorizing Provider  methadone (DOLOPHINE) 10 MG tablet Take 10 mg by mouth 2 (two) times daily. 05/15/14  Yes Historical Provider, MD  amoxicillin (AMOXIL) 500 MG capsule 2 po bid with food Patient not taking: Reported on 06/02/2014 10/26/11   Lily Kocher, PA-C  cephALEXin (KEFLEX) 500 MG capsule Take 1 capsule (500 mg total) by mouth 4 (four) times daily. 11/01/14   Ezequiel Essex, MD  meloxicam (MOBIC) 7.5 MG tablet 1 po bid with food Patient not taking: Reported on 06/02/2014 10/26/11   Lily Kocher, PA-C  ondansetron (ZOFRAN ODT) 4 MG disintegrating tablet 4mg  ODT q4 hours prn nausea/vomit Patient not taking: Reported on 11/01/2014 06/02/14   Milton Ferguson, MD   BP  125/80 mmHg  Pulse 86  Temp(Src) 98.2 F (36.8 C) (Oral)  Resp 17  Ht 5\' 8"  (1.727 m)  Wt 163 lb 4 oz (74.05 kg)  BMI 24.83 kg/m2  SpO2 99%  LMP 03/15/2014 Physical Exam  Constitutional: She is oriented to person, place, and time. She appears well-developed and well-nourished. No distress.  HENT:  Head: Normocephalic and atraumatic.  Mouth/Throat: Oropharynx is clear and moist. No oropharyngeal exudate.   Eyes: Conjunctivae and EOM are normal. Pupils are equal, round, and reactive to light.  Neck: Normal range of motion. Neck supple.  No meningismus.  Cardiovascular: Normal rate, regular rhythm, normal heart sounds and intact distal pulses.   No murmur heard. Tachycardia to 110  Pulmonary/Chest: Effort normal and breath sounds normal. No respiratory distress.  Abdominal: Soft. There is no tenderness. There is no rebound and no guarding.  Gravid abdomen, nontender  Musculoskeletal: Normal range of motion. She exhibits no edema or tenderness.  Neurological: She is alert and oriented to person, place, and time. No cranial nerve deficit. She exhibits normal muscle tone. Coordination normal.  No ataxia on finger to nose bilaterally. No pronator drift. 5/5 strength throughout. CN 2-12 intact. Negative Romberg. Equal grip strength. Sensation intact. Gait is normal.   Skin: Skin is warm.  Psychiatric: She has a normal mood and affect. Her behavior is normal.  Nursing note and vitals reviewed.   ED Course  Procedures (including critical care time) Labs Review Labs Reviewed  URINALYSIS, ROUTINE W REFLEX MICROSCOPIC (NOT AT [facility]) - Abnormal; Notable for the following:    Color, Urine AMBER (*)    APPearance CLOUDY (*)    Hgb urine dipstick MODERATE (*)    Bilirubin Urine MODERATE (*)    Protein, ur 100 (*)    Urobilinogen, UA 4.0 (*)    Leukocytes, UA MODERATE (*)    All other components within normal limits  CBC WITH DIFFERENTIAL/PLATELET - Abnormal; Notable for the following:    Hemoglobin 10.2 (*)    HCT 33.0 (*)    MCV 75.3 (*)    MCH 23.3 (*)    Platelets 126 (*)    Neutrophils Relative % 83 (*)    Lymphocytes Relative 11 (*)    All other components within normal limits  COMPREHENSIVE METABOLIC PANEL - Abnormal; Notable for the following:    Sodium 133 (*)    Glucose, Bld 118 (*)    Calcium 8.6 (*)    Albumin 2.2 (*)    Alkaline Phosphatase 174 (*)    All other components  within normal limits  PROTEIN / CREATININE RATIO, URINE - Abnormal; Notable for the following:    Protein Creatinine Ratio 0.53 (*)    All other components within normal limits  URINE MICROSCOPIC-ADD ON - Abnormal; Notable for the following:    Squamous Epithelial / LPF FEW (*)    Bacteria, UA MANY (*)    All other components within normal limits  URINE CULTURE  PATHOLOGIST SMEAR REVIEW    Imaging Review No results found.   EKG Interpretation   Date/Time:  Sunday November 01 2014 13:22:03 EDT Ventricular Rate:  89 PR Interval:  141 QRS Duration: 81 QT Interval:  348 QTC Calculation: 423 R Axis:   82 Text Interpretation:  Sinus rhythm No significant change was found  Confirmed by Wyvonnia Dusky  MD, Adalene Gulotta [phone]) on 11/01/2014 1:35:04 PM      MDM   Final diagnoses:  Urinary tract infection without hematuria, site unspecified  Pregnancy  Weakness   Generalized weakness with fatigue, headache, G8 P7 at [redacted] weeks gestation. No contractions, abdominal pain, vaginal bleeding. Nonfocal neurological exam.  Hemoglobin 10.2 was 13.7 in January 2016.  Patient does not know her baseline but was told that she was anemic.  Denies any dark or bloody stools.  Advised rectal exam for FOBT, patient refuses.  HR 105 with standing.  94 with lying. Toco monitor with scattered contractions.  Reviewed by Dr. Harolyn Rutherford who cleared patient with reassuring tracing.  Discussed with Dr. Barrie Dunker. Patient previous hemoglobin in May was 10.5. Patient is supposed to be taking iron as well as Lovenox due to her previous history of PE. Patient has been noncompliant with these treatments. It is too late in her pregnancy to take Lovenox. No tachycardia, hypoxia, chest pain, or SOB to suggest PE today.  Patient will be seen by Dr. Barrie Dunker tomorrow at 1 PM. She'll be treated for her UTI. Encouraged oral hydration at home. Blood pressure readings as well as elevated urine protein creatinine ratio reviewed with Dr. Barrie Dunker.  He does not feel this represents preeclampsia. Has no elevated blood pressures to suggest preeclampsia. Her blood pressure is in the 120/80 range.  She appears stable for OB follow-up tomorrow. Will treat UTI with Keflex. She is to see her obstetrician tomorrow at 1 PM. Return precautions discussed.   Ezequiel Essex, MD 11/01/14 [phone]

## 2014-11-01 NOTE — Progress Notes (Signed)
Spoke with Patent examiner. FHR tracing is intermittent at this time. Cardio needs to be adjusted. Previous tracing is category 1. Occasional uc's. Spoke with Dr. Harolyn Rutherford and pt is okay to be d/c home and is to follow up with her OB.

## 2014-11-01 NOTE — Discharge Instructions (Signed)
Urinary Tract Infection  Follow up with Dr. Barrie Dunker at 1 pm tomorrow. Take the antibiotics prescribed. Return to the ED if you develops new or worsening symptoms. Urinary tract infections (UTIs) can develop anywhere along your urinary tract. Your urinary tract is your body's drainage system for removing wastes and extra water. Your urinary tract includes two kidneys, two ureters, a bladder, and a urethra. Your kidneys are a pair of bean-shaped organs. Each kidney is about the size of your fist. They are located below your ribs, one on each side of your spine. CAUSES Infections are caused by microbes, which are microscopic organisms, including fungi, viruses, and bacteria. These organisms are so small that they can only be seen through a microscope. Bacteria are the microbes that most commonly cause UTIs. SYMPTOMS  Symptoms of UTIs may vary by age and gender of the patient and by the location of the infection. Symptoms in young women typically include a frequent and intense urge to urinate and a painful, burning feeling in the bladder or urethra during urination. Older women and men are more likely to be tired, shaky, and weak and have muscle aches and abdominal pain. A fever may mean the infection is in your kidneys. Other symptoms of a kidney infection include pain in your back or sides below the ribs, nausea, and vomiting. DIAGNOSIS To diagnose a UTI, your caregiver will ask you about your symptoms. Your caregiver also will ask to provide a urine sample. The urine sample will be tested for bacteria and white blood cells. White blood cells are made by your body to help fight infection. TREATMENT  Typically, UTIs can be treated with medication. Because most UTIs are caused by a bacterial infection, they usually can be treated with the use of antibiotics. The choice of antibiotic and length of treatment depend on your symptoms and the type of bacteria causing your infection. HOME CARE INSTRUCTIONS  If  you were prescribed antibiotics, take them exactly as your caregiver instructs you. Finish the medication even if you feel better after you have only taken some of the medication.  Drink enough water and fluids to keep your urine clear or pale yellow.  Avoid caffeine, tea, and carbonated beverages. They tend to irritate your bladder.  Empty your bladder often. Avoid holding urine for long periods of time.  Empty your bladder before and after sexual intercourse.  After a bowel movement, women should cleanse from front to back. Use each tissue only once. SEEK MEDICAL CARE IF:   You have back pain.  You develop a fever.  Your symptoms do not begin to resolve within 3 days. SEEK IMMEDIATE MEDICAL CARE IF:   You have severe back pain or lower abdominal pain.  You develop chills.  You have nausea or vomiting.  You have continued burning or discomfort with urination. MAKE SURE YOU:   Understand these instructions.  Will watch your condition.  Will get help right away if you are not doing well or get worse. Document Released: 02/08/2005 Document Revised: 10/31/2011 Document Reviewed: 06/09/2011 [facility] Patient Information 2015 [place], [place]. This information is not intended to replace advice given to you by your health care provider. Make sure you discuss any questions you have with your health care provider.

## 2014-11-02 LAB — PATHOLOGIST SMEAR REVIEW

## 2014-11-04 ENCOUNTER — Telehealth (HOSPITAL_BASED_OUTPATIENT_CLINIC_OR_DEPARTMENT_OTHER): Payer: Self-pay | Admitting: Emergency Medicine

## 2014-11-04 LAB — URINE CULTURE

## 2014-11-04 NOTE — Telephone Encounter (Signed)
Post ED Visit - Positive Culture Follow-up  Culture report reviewed by antimicrobial stewardship pharmacist: []  Wes [place], Pharm.D., BCPS []  Heide Guile, Pharm.D., BCPS [x]  Alycia Rossetti, Pharm.D., BCPS []  [place], Pharm.D., BCPS, AAHIVP []  Legrand Como, Pharm.D., BCPS, AAHIVP []  Isac Sarna, Pharm.D., BCPS  Positive urine  Culture E. coli Treated with cephalexin, organism sensitive to the same and no further patient follow-up is required at this time.  Hazle Nordmann 11/04/2014, 2:16 PM

## 2014-11-05 ENCOUNTER — Telehealth (HOSPITAL_BASED_OUTPATIENT_CLINIC_OR_DEPARTMENT_OTHER): Payer: Self-pay | Admitting: Emergency Medicine

## 2014-11-05 NOTE — Telephone Encounter (Signed)
Post ED Visit - Positive Culture Follow-up  Culture report reviewed by antimicrobial stewardship pharmacist: []  Wes [place], Pharm.D., BCPS []  Heide Guile, Pharm.D., BCPS [x]  Alycia Rossetti, Pharm.D., BCPS []  Rebecca, Pharm.D., BCPS, AAHIVP []  Legrand Como, Pharm.D., BCPS, AAHIVP []  Isac Sarna, Pharm.D., BCPS  Positive urine culture E. Coli Treated with cephalexin, organism sensitive to the same and no further patient follow-up is required at this time.  Hazle Nordmann 11/05/2014, 10:13 AM

## 2015-10-20 ENCOUNTER — Encounter: Payer: Self-pay | Admitting: Obstetrics & Gynecology

## 2015-12-27 ENCOUNTER — Other Ambulatory Visit (INDEPENDENT_AMBULATORY_CARE_PROVIDER_SITE_OTHER): Payer: Medicaid Other

## 2015-12-27 ENCOUNTER — Other Ambulatory Visit: Payer: Self-pay | Admitting: Obstetrics & Gynecology

## 2015-12-27 ENCOUNTER — Ambulatory Visit (INDEPENDENT_AMBULATORY_CARE_PROVIDER_SITE_OTHER): Payer: Medicaid Other | Admitting: Obstetrics & Gynecology

## 2015-12-27 ENCOUNTER — Encounter: Payer: Self-pay | Admitting: Obstetrics & Gynecology

## 2015-12-27 VITALS — BP 122/78 | HR 90 | Ht 68.0 in | Wt 174.0 lb

## 2015-12-27 DIAGNOSIS — D251 Intramural leiomyoma of uterus: Secondary | ICD-10-CM | POA: Diagnosis not present

## 2015-12-27 DIAGNOSIS — D259 Leiomyoma of uterus, unspecified: Secondary | ICD-10-CM | POA: Diagnosis not present

## 2015-12-27 DIAGNOSIS — N938 Other specified abnormal uterine and vaginal bleeding: Secondary | ICD-10-CM | POA: Diagnosis not present

## 2015-12-27 DIAGNOSIS — N83202 Unspecified ovarian cyst, left side: Secondary | ICD-10-CM

## 2015-12-27 MED ORDER — MEGESTROL ACETATE 40 MG PO TABS: ORAL_TABLET | ORAL | 3 refills | Status: DC

## 2015-12-27 NOTE — Progress Notes (Signed)
PELVIC US TA/TV: Heterogenous anteverted uterus w/two intramural fibroids (#1) ant  2 x 1.5 x 2.3 cm,(#2) post 2.5 x 1.6 x 1.6 cm,EEC 9.2 mm,normal right ovary,simple lt ov cyst 2.8 x 1.7 x 2.1 cm,no free fluid,pelvic pain during ultrasound.

## 2015-12-27 NOTE — Progress Notes (Signed)
      Chief Complaint  Patient presents with  . Menstrual Problem    del 5/19,bleeding heavily at times,swelling in stomach,baby passed 2 days after delivery    Blood pressure 122/78, pulse 90, height 5\' 8"  (1.727 m), weight 174 lb (78.9 kg), last menstrual period 12/22/2015, unknown if currently breastfeeding.  35 y.o. [id number] Patient's last menstrual period was 12/22/2015. The current method of family planning is tubal ligation.  Subjective Patient comes in complaining of on again off again bleeding since she delivered on May 19 She's had very few days where she isn't bleeding she thinks she may have had a period about 3 weeks ago but is had brown old looking blood spotting throughout Intercourse has not been a problem no pain associated with intercourse Not much pain associated with her bleeding in general except when she is bleeding heavier and its crampy  Objective Vulva:  normal appearing vulva with no masses, tenderness or lesions Vagina:  normal mucosa, scant blood Cervix:  no cervical motion tenderness and no lesions Uterus:  normal size, contour, position, consistency, mobility, non-tender and retroverted Adnexa: ovaries:present,  normal adnexa in size, nontender and no masses   Pertinent ROS No burning with urination, frequency or urgency No nausea, vomiting or diarrhea Nor fever chills or other constitutional symptoms   Labs or studies Sonogram report is pending, see summary below    Impression Diagnoses this Encounter::   ICD-9-CM ICD-10-CM   1. DUB (dysfunctional uterine bleeding) 626.8 N93.8     Established relevant diagnosis(es):   Plan/Recommendations: Meds ordered this encounter  Medications  . amphetamine-dextroamphetamine (ADDERALL) 20 MG tablet    Sig: Take 20 mg by mouth 2 (two) times daily.  . simvastatin (ZOCOR) 20 MG tablet    Sig: Take 20 mg by mouth daily.  . megestrol (MEGACE) 40 MG tablet    Sig: 3 tablets a day for 5 days, 2  tablets a day for 5 days then 1 tablet daily    Dispense:  45 tablet    Refill:  3    Labs or Scans Ordered: Sonogram done, report to be read, but endometrium normal small clinically insignificant fibroids and physiologic left ovarian cyst  Management:: Megestrol algorithm to synchronized the endometrium  Follow up Return in about 6 weeks (around 02/07/2016) for Follow up, with Dr Elonda Husky.         All questions were answered.

## 2016-02-08 ENCOUNTER — Ambulatory Visit: Payer: Medicaid Other | Admitting: Obstetrics & Gynecology

## 2016-02-24 ENCOUNTER — Encounter: Payer: Self-pay | Admitting: Obstetrics & Gynecology

## 2016-02-24 ENCOUNTER — Ambulatory Visit: Payer: Medicaid Other | Admitting: Obstetrics & Gynecology

## 2016-02-29 ENCOUNTER — Encounter: Payer: Self-pay | Admitting: Obstetrics & Gynecology

## 2016-02-29 ENCOUNTER — Ambulatory Visit: Payer: Medicaid Other | Admitting: Obstetrics & Gynecology

## 2016-05-01 ENCOUNTER — Ambulatory Visit (INDEPENDENT_AMBULATORY_CARE_PROVIDER_SITE_OTHER): Payer: Medicaid Other | Admitting: Obstetrics & Gynecology

## 2016-05-01 ENCOUNTER — Encounter: Payer: Self-pay | Admitting: Obstetrics & Gynecology

## 2016-05-01 VITALS — BP 120/80 | HR 78 | Ht 68.0 in | Wt 197.0 lb

## 2016-05-01 DIAGNOSIS — N946 Dysmenorrhea, unspecified: Secondary | ICD-10-CM | POA: Diagnosis not present

## 2016-05-01 DIAGNOSIS — N938 Other specified abnormal uterine and vaginal bleeding: Secondary | ICD-10-CM | POA: Diagnosis not present

## 2016-05-01 NOTE — Progress Notes (Signed)
Preoperative History and Physical  Ann Hoover a 57 [id number] with No LMP recorded.admitted for a hysteroscopy uterine curettage endometrial ablation.  Pt with almost constant bleeding and cramping somewhat improved on megestrol but not managed effectively sonogram is normal  PMH:      Past Medical History:  Diagnosis Date  . DVT (deep venous thrombosis) ([place])   . Herniated disc   . Pulmonary embolus ([facility])     PSH: History reviewed. No pertinent surgical history.  POb/GynH:          OB History   Gravida Para Term Preterm AB Living   8 7 7  0 0 7   SAB TAB Ectopic Multiple Live Births   0 0 0 0       SH:      Social History  Substance Use Topics  . Smoking status: Current Every Day Smoker    Packs/day: 1.00    Years: 12.00    Types: Cigarettes  . Smokeless tobacco: Never Used  . Alcohol use No    FH:        Family History  Problem Relation Age of Onset  . Hyperlipidemia Mother   . Diabetes Father   . Cancer Father     [place]  . COPD Father   . Cirrhosis Father   . Pancreatic cancer Maternal Grandmother   . Colon cancer Paternal Grandmother      Allergies:No Known Allergies  Medications:  Current Outpatient Prescriptions:  . amphetamine-dextroamphetamine (ADDERALL) 20 MG tablet, Take 20 mg by mouth 2 (two) times daily., Disp: , Rfl:  . megestrol (MEGACE) 40 MG tablet, 3 tablets a day for 5 days, 2 tablets a day for 5 days then 1 tablet daily, Disp: 45 tablet, Rfl: 3 . methadone (DOLOPHINE) 10 MG tablet, Take 10 mg by mouth 2 (two) times daily., Disp: , Rfl: 0  Review of Systems:  Review of Systems  Constitutional: Negative for fever, chills, weight loss, malaise/fatigue and diaphoresis.  HENT: Negative for hearing loss, ear pain, nosebleeds, congestion, sore throat, neck pain, tinnitus and ear discharge.  Eyes: Negative for blurred vision, double vision,  photophobia, pain, discharge and redness.  Respiratory: Negative for cough, hemoptysis, sputum production, shortness of breath, wheezing and stridor.  Cardiovascular: Negative for chest pain, palpitations, orthopnea, claudication, leg swelling and PND.  Gastrointestinal: Positive for abdominal pain. Negative for heartburn, nausea, vomiting, diarrhea, constipation, blood in stool and melena.  Genitourinary: Negative for dysuria, urgency, frequency, hematuria and flank pain.  Musculoskeletal: Negative for myalgias, back pain, joint pain and falls.  Skin: Negative for itching and rash.  Neurological: Negative for dizziness, tingling, tremors, sensory change, speech change, focal weakness, seizures, loss of consciousness, weakness and headaches.  Endo/Heme/Allergies: Negative for environmental allergies and polydipsia. Does not bruise/bleed easily.  Psychiatric/Behavioral: Negative for depression, suicidal ideas, hallucinations, memory loss and substance abuse. The patient is not nervous/anxious and does not have insomnia.     PHYSICAL EXAM:  Blood pressure 120/80, pulse 78, height 5\' 8"  (1.727 m), weight 197 lb (89.4 kg), unknown if currently breastfeeding.   Vitals reviewed. Constitutional: She is oriented to person, place, and time. She appears well-developed and well-nourished.  HENT:  Head: Normocephalic and atraumatic.  Right Ear: External ear normal.  Left Ear: External ear normal.  Nose: Nose normal.  Mouth/Throat: Oropharynx is clear and moist.  Eyes: Conjunctivae and EOM are normal. Pupils are equal, round, and reactive to light. Right eye exhibits no discharge. Left eye exhibits no discharge.  No scleral icterus.  Neck: Normal range of motion. Neck supple. No tracheal deviation present. No thyromegaly present.  Cardiovascular: Normal rate, regular rhythm, normal heart sounds and intact distal pulses. Exam reveals no gallop and no friction rub.  No murmur  heard. Respiratory: Effort normal and breath sounds normal. No respiratory distress. She has no wheezes. She has no rales. She exhibits no tenderness.  GI: Soft. Bowel sounds are normal. She exhibits no distension and no mass. There is tenderness. There is no rebound and no guarding.  Genitourinary:  Vulva is normal without lesions Vagina is pink moist without discharge Cervix normal in appearance and pap is normal Uterus is normal size, contour, position, consistency, mobility, non-tender Adnexa is negative with normal sized ovaries by sonogram Musculoskeletal: Normal range of motion. She exhibits no edema and no tenderness.  Neurological: She is alert and oriented to person, place, and time. She has normal reflexes. She displays normal reflexes. No cranial nerve deficit. She exhibits normal muscle tone. Coordination normal.  Skin: Skin is warm and dry. No rash noted. No erythema. No pallor.  Psychiatric: She has a normal mood and affect. Her behavior is normal. Judgment and thought content normal.    Labs: No results found for this or any previous visit (from the past 336 hour(s)).  EKG:    Orders placed or performed during the hospital encounter of 11/01/14  . ED EKG  . ED EKG  . EKG 12-Lead  . EKG 12-Lead  . EKG    Imaging Studies: ImagingResults  No results found.      Assessment: Menometrorrhagia dysmenorrhea   Plan: Hysteroscopy uterine curettage endometrial ablation 06/08/2015  Ann Hoover

## 2016-05-02 ENCOUNTER — Telehealth: Payer: Self-pay | Admitting: Obstetrics & Gynecology

## 2016-05-02 ENCOUNTER — Other Ambulatory Visit: Payer: Self-pay | Admitting: Obstetrics & Gynecology

## 2016-05-02 MED ORDER — MEGESTROL ACETATE 40 MG PO TABS: ORAL_TABLET | ORAL | 3 refills | Status: DC

## 2016-05-02 NOTE — Telephone Encounter (Signed)
Patient would like a refill on her Megace please. She is "pouring blood" and needs something until her surgery. Please advise.

## 2016-05-03 NOTE — Telephone Encounter (Signed)
Left message on VM that prescription was refilled.

## 2016-05-09 NOTE — Patient Instructions (Signed)
Ann Hoover  05/09/2016     @PREFPERIOPPHARMACY @   Your procedure is scheduled on  05/17/2016   Report to [facility] at  1000 A.M.  Call this number if you have problems the morning of surgery:  [phone]   Remember:  Do not eat food or drink liquids after midnight.  Take these medicines the morning of surgery with A SIP OF WATER  Adderall, methadone.   Do not wear jewelry, make-up or nail polish.  Do not wear lotions, powders, or perfumes, or deoderant.  Do not shave 48 hours prior to surgery.  Men may shave face and neck.  Do not bring valuables to the hospital.  [facility] is not responsible for any belongings or valuables.  Contacts, dentures or bridgework may not be worn into surgery.  Leave your suitcase in the car.  After surgery it may be brought to your room.  For patients admitted to the hospital, discharge time will be determined by your treatment team.  Patients discharged the day of surgery will not be allowed to drive home.   Name and phone number of your driver:   family Special instructions:  none  Please read over the following fact sheets that you were given. Anesthesia Post-op Instructions and Care and Recovery After Surgery       Dilation and Curettage or Vacuum Curettage Dilation and curettage (D&C) and vacuum curettage are minor procedures. A D&C involves stretching (dilation) the cervix and scraping (curettage) the inside lining of the uterus (endometrium). During a D&C, tissue is gently scraped from the endometrium, starting from the top portion of the uterus down to the lowest part of the uterus (cervix). During a vacuum curettage, the lining and tissue in the uterus are removed with the use of gentle suction. Curettage may be performed to either diagnose or treat a problem. As a diagnostic procedure, curettage is performed to examine tissues from the uterus. A diagnostic curettage may be done if you have:  Irregular  bleeding in the uterus.  Bleeding with the development of clots.  Spotting between menstrual periods.  Prolonged menstrual periods or other abnormal bleeding.  Bleeding after menopause.  No menstrual period (amenorrhea).  A change in size and shape of the uterus.  Abnormal endometrial cells discovered during a Pap test. As a treatment procedure, curettage may be performed for the following reasons:  Removal of an IUD (intrauterine device).  Removal of retained placenta after giving birth.  Abortion.  Miscarriage.  Removal of endometrial polyps.  Removal of uncommon types of noncancerous lumps (fibroids). Tell a health care provider about:  Any allergies you have, including allergies to prescribed medicine or latex.  All medicines you are taking, including vitamins, herbs, eye drops, creams, and over-the-counter medicines. This is especially important if you take any blood-thinning medicine. Bring a list of all of your medicines to your appointment.  Any problems you or family members have had with anesthetic medicines.  Any blood disorders you have.  Any surgeries you have had.  Your medical history and any medical conditions you have.  Whether you are pregnant or may be pregnant.  Recent vaginal infections you have had.  Recent menstrual periods, bleeding problems you have had, and what form of birth control (contraception) you use. What are the risks? Generally, this is a safe procedure. However, problems may occur, including:  Infection.  Heavy vaginal bleeding.  Allergic reactions to medicines.  Damage to the cervix or other structures or organs.  Development of scar tissue (adhesions) inside the uterus, which can cause abnormal amounts of menstrual bleeding. This may make it harder to get pregnant in the future.  A hole (perforation) or puncture in the uterine wall. This is rare. What happens before the procedure? Staying hydrated  Follow  instructions from your health care provider about hydration, which may include:  Up to 2 hours before the procedure - you may continue to drink clear liquids, such as water, clear fruit juice, black coffee, and plain tea. Eating and drinking restrictions  Follow instructions from your health care provider about eating and drinking, which may include:  8 hours before the procedure - stop eating heavy meals or foods such as meat, fried foods, or fatty foods.  6 hours before the procedure - stop eating light meals or foods, such as toast or cereal.  6 hours before the procedure - stop drinking milk or drinks that contain milk.  2 hours before the procedure - stop drinking clear liquids. If your health care provider told you to take your medicine(s) on the day of your procedure, take them with only a sip of water. Medicines  Ask your health care provider about:  Changing or stopping your regular medicines. This is especially important if you are taking diabetes medicines or blood thinners.  Taking medicines such as aspirin and ibuprofen. These medicines can thin your blood. Do not take these medicines before your procedure if your health care provider instructs you not to.  You may be given antibiotic medicine to help prevent infection. General instructions  For 24 hours before your procedure, do not:  Douche.  Use tampons.  Use medicines, creams, or suppositories in the vagina.  Have sexual intercourse.  You may be given a pregnancy test on the day of the procedure.  Plan to have someone take you home from the hospital or clinic.  You may have a blood or urine sample taken.  If you will be going home right after the procedure, plan to have someone with you for 24 hours. What happens during the procedure?  To reduce your risk of infection:  Your health care team will wash or sanitize their hands.  Your skin will be washed with soap.  An IV tube will be inserted into one  of your veins.  You will be given one of the following:  A medicine that numbs the area in and around the cervix (local anesthetic).  A medicine to make you fall asleep (general anesthetic).  You will lie down on your back, with your feet in foot rests (stirrups).  The size and position of your uterus will be checked.  A lubricated instrument (speculum or Sims retractor) will be inserted into the back side of your vagina. The speculum will be used to hold apart the walls of your vagina so your health care provider can see your cervix.  A tool (tenaculum) will be attached to the lip of the cervix to stabilize it.  Your cervix will be softened and dilated. This may be done by:  Taking a medicine.  Having tapered dilators or thin rods (laminaria) or gradual widening instruments (tapered dilators) inserted into your cervix.  A small, sharp, curved instrument (curette) will be used to scrape a small amount of tissue or cells from the endometrium or cervical canal. In some cases, gentle suction is applied with the curette. The curette will  then be removed. The cells will be taken to a lab for testing. The procedure may vary among health care providers and hospitals. What happens after the procedure?  You may have mild cramping, backache, pain, and light bleeding or spotting. You may pass small blood clots from your vagina.  You may have to wear compression stockings. These stockings help to prevent blood clots and reduce swelling in your legs.  Your blood pressure, heart rate, breathing rate, and blood oxygen level will be monitored until the medicines you were given have worn off. Summary  Dilation and curettage (D&C) involves stretching (dilation) the cervix and scraping (curettage) the inside lining of the uterus (endometrium).  After the procedure, you may have mild cramping, backache, pain, and light bleeding or spotting. You may pass small blood clots from your vagina.  Plan to  have someone take you home from the hospital or clinic. This information is not intended to replace advice given to you by your health care provider. Make sure you discuss any questions you have with your health care provider. Document Released: 05/01/2005 Document Revised: 01/16/2016 Document Reviewed: 01/16/2016 Elsevier Interactive Patient Education  2017 [employer]  Dilation and Curettage or Vacuum Curettage, Care After These instructions give you information about caring for yourself after your procedure. Your doctor may also give you more specific instructions. Call your doctor if you have any problems or questions after your procedure. Follow these instructions at home: Activity  Do not drive or use heavy machinery while taking prescription pain medicine.  For 24 hours after your procedure, avoid driving.  Take short walks often, followed by rest periods. Ask your doctor what activities are safe for you. After one or two days, you may be able to return to your normal activities.  Do not lift anything that is heavier than 10 lb (4.5 kg) until your doctor approves.  For at least 2 weeks, or as long as told by your doctor:  Do not douche.  Do not use tampons.  Do not have sex. General instructions  Take over-the-counter and prescription medicines only as told by your doctor. This is very important if you take blood thinning medicine.  Do not take baths, swim, or use a hot tub until your doctor approves. Take showers instead of baths.  Wear compression stockings as told by your doctor.  It is up to you to get the results of your procedure. Ask your doctor when your results will be ready.  Keep all follow-up visits as told by your doctor. This is important. Contact a doctor if:  You have very bad cramps that get worse or do not get better with medicine.  You have very bad pain in your belly (abdomen).  You cannot drink fluids without throwing up (vomiting).  You get  pain in a different part of the area between your belly and thighs (pelvis).  You have bad-smelling discharge from your vagina.  You have a rash. Get help right away if:  You are bleeding a lot from your vagina. A lot of bleeding means soaking more than one sanitary pad in an hour, for 2 hours in a row.  You have clumps of blood (blood clots) coming from your vagina.  You have a fever or chills.  Your belly feels very tender or hard.  You have chest pain.  You have trouble breathing.  You cough up blood.  You feel dizzy.  You feel light-headed.  You pass out (faint).  You have  pain in your neck or shoulder area. Summary  Take short walks often, followed by rest periods. Ask your doctor what activities are safe for you. After one or two days, you may be able to return to your normal activities.  Do not lift anything that is heavier than 10 lb (4.5 kg) until your doctor approves.  Do not take baths, swim, or use a hot tub until your doctor approves. Take showers instead of baths.  Contact your doctor if you have any symptoms of infection, like bad-smelling discharge from your vagina. This information is not intended to replace advice given to you by your health care provider. Make sure you discuss any questions you have with your health care provider. Document Released: 02/08/2008 Document Revised: 01/17/2016 Document Reviewed: 01/17/2016 Elsevier Interactive Patient Education  2017 [employer]. Endometrial Ablation Endometrial ablation removes the lining of the uterus (endometrium). It is usually a same-day, outpatient treatment. Ablation helps avoid major surgery, such as surgery to remove the cervix and uterus (hysterectomy). After endometrial ablation, you will have little or no menstrual bleeding and may not be able to have children. However, if you are premenopausal, you will need to use a reliable method of birth control following the procedure because of the small  chance that pregnancy can occur. There are different reasons to have this procedure. These reasons include:  Heavy periods.  Bleeding that is causing anemia.  Irregular bleeding.  Bleeding fibroids on the lining inside the uterus if they are smaller than 3 centimeters. This procedure may not be possible for you if:   You want to have children in the future.   You have severe cramps with your menstrual period.   You have precancerous or cancerous cells in your uterus.   You were recently pregnant.   You have gone through menopause.   You have had major surgery on your uterus, resulting in thinning of the uterine wall. Surgeries may include:  The removal of one or more uterine fibroids (myomectomy).  A cesarean section with a classic (vertical) incision on your uterus. Ask your health care provider what type of cesarean you had. Sometimes the scar on your skin is different than the scar on your uterus. Even if you have had surgery on your uterus, certain types of ablation may still be safe for you. Talk with your health care provider. LET [facility] CARE PROVIDER KNOW ABOUT:  Any allergies you have.  All medicines you are taking, including vitamins, herbs, eye drops, creams, and over-the-counter medicines.  Previous problems you or members of your family have had with the use of anesthetics.  Any blood disorders you have.  Previous surgeries you have had.  Medical conditions you have. RISKS AND COMPLICATIONS  Generally, this is a safe procedure. However, as with any procedure, complications can occur. Possible complications include:  Perforation of the uterus.  Bleeding.  Infection of the uterus, bladder, or vagina.  Injury to surrounding organs.  An air bubble to the lung (air embolus).  Pregnancy following the procedure.  Failure of the procedure to help the problem, requiring hysterectomy.  Decreased ability to diagnose cancer in the lining of the  uterus. BEFORE THE PROCEDURE  The lining of the uterus must be tested to make sure there is no pre-cancerous or cancer cells present.  An ultrasound may be performed to look at the size of the uterus and to check for abnormalities.  Medicines may be given to thin the lining of the uterus. PROCEDURE  During the procedure, your health care provider will use a tool called a resectoscope to help see inside your uterus. There are different ways to remove the lining of your uterus.   Radiofrequency - This method uses a radiofrequency-alternating electric current to remove the lining of the uterus.  Cryotherapy - This method uses extreme cold to freeze the lining of the uterus.  Heated-Free Liquid - This method uses heated salt (saline) solution to remove the lining of the uterus.  Microwave - This method uses high-energy microwaves to heat up the lining of the uterus to remove it.  Thermal balloon - This method involves inserting a catheter with a balloon tip into the uterus. The balloon tip is filled with heated fluid to remove the lining of the uterus. AFTER THE PROCEDURE  After your procedure, do not have sexual intercourse or insert anything into your vagina until permitted by your health care provider. After the procedure, you may experience:  Cramps.  Vaginal discharge.  Frequent urination. This information is not intended to replace advice given to you by your health care provider. Make sure you discuss any questions you have with your health care provider. Document Released: 03/10/2004 Document Revised: 01/20/2015 Document Reviewed: 10/02/2012 Elsevier Interactive Patient Education  2017 [place] Anesthesia, Adult General anesthesia is the use of medicines to make a person "go to sleep" (be unconscious) for a medical procedure. General anesthesia is often recommended when a procedure:  Is long.  Requires you to be still or in an unusual position.  Is major and  can cause you to lose blood.  Is impossible to do without general anesthesia. The medicines used for general anesthesia are called general anesthetics. In addition to making you sleep, the medicines:  Prevent pain.  Control your blood pressure.  Relax your muscles. Tell a health care provider about:  Any allergies you have.  All medicines you are taking, including vitamins, herbs, eye drops, creams, and over-the-counter medicines.  Any problems you or family members have had with anesthetic medicines.  Types of anesthetics you have had in the past.  Any bleeding disorders you have.  Any surgeries you have had.  Any medical conditions you have.  Any history of heart or lung conditions, such as heart failure, sleep apnea, or chronic obstructive pulmonary disease (COPD).  Whether you are pregnant or may be pregnant.  Whether you use tobacco, alcohol, marijuana, or street drugs.  Any history of Armed forces logistics/support/administrative officer.  Any history of depression or anxiety. What are the risks? Generally, this is a safe procedure. However, problems may occur, including:  Allergic reaction to anesthetics.  Lung and heart problems.  Inhaling food or liquids from your stomach into your lungs (aspiration).  Injury to nerves.  Waking up during your procedure and being unable to move (rare).  Extreme agitation or a state of mental confusion (delirium) when you wake up from the anesthetic.  Air in the bloodstream, which can lead to stroke. These problems are more likely to develop if you are having a major surgery or if you have an advanced medical condition. You can prevent some of these complications by answering all of your health care provider's questions thoroughly and by following all pre-procedure instructions. General anesthesia can cause side effects, including:  Nausea or vomiting  A sore throat from the breathing tube.  Feeling cold or shivery.  Feeling tired, washed out, or  achy.  Sleepiness or drowsiness.  Confusion or agitation. What happens before the  procedure? Staying hydrated  Follow instructions from your health care provider about hydration, which may include:  Up to 2 hours before the procedure - you may continue to drink clear liquids, such as water, clear fruit juice, black coffee, and plain tea. Eating and drinking restrictions  Follow instructions from your health care provider about eating and drinking, which may include:  8 hours before the procedure - stop eating heavy meals or foods such as meat, fried foods, or fatty foods.  6 hours before the procedure - stop eating light meals or foods, such as toast or cereal.  6 hours before the procedure - stop drinking milk or drinks that contain milk.  2 hours before the procedure - stop drinking clear liquids. Medicines  Ask your health care provider about:  Changing or stopping your regular medicines. This is especially important if you are taking diabetes medicines or blood thinners.  Taking medicines such as aspirin and ibuprofen. These medicines can thin your blood. Do not take these medicines before your procedure if your health care provider instructs you not to.  Taking new dietary supplements or medicines. Do not take these during the week before your procedure unless your health care provider approves them.  If you are told to take a medicine or to continue taking a medicine on the day of the procedure, take the medicine with sips of water. General instructions   Ask if you will be going home the same day, the following day, or after a longer hospital stay.  Plan to have someone take you home.  Plan to have someone stay with you for the first 24 hours after you leave the hospital or clinic.  For 3-6 weeks before the procedure, try not to use any tobacco products, such as cigarettes, chewing tobacco, and e-cigarettes.  You may brush your teeth on the morning of the procedure,  but make sure to spit out the toothpaste. What happens during the procedure?  You will be given anesthetics through a mask and through an IV tube in one of your veins.  You may receive medicine to help you relax (sedative).  As soon as you are asleep, a breathing tube may be used to help you breathe.  An anesthesia specialist will stay with you throughout the procedure. He or she will help keep you comfortable and safe by continuing to give you medicines and adjusting the amount of medicine that you get. He or she will also watch your blood pressure, pulse, and oxygen levels to make sure that the anesthetics do not cause any problems.  If a breathing tube was used to help you breathe, it will be removed before you wake up. The procedure may vary among health care providers and hospitals. What happens after the procedure?  You will wake up, often slowly, after the procedure is complete, usually in a recovery area.  Your blood pressure, heart rate, breathing rate, and blood oxygen level will be monitored until the medicines you were given have worn off.  You may be given medicine to help you calm down if you feel anxious or agitated.  If you will be going home the same day, your health care provider may check to make sure you can stand, drink, and urinate.  Your health care providers will treat your pain and side effects before you go home.  Do not drive for 24 hours if you received a sedative.  You may:  Feel nauseous and vomit.  Have a sore throat.  Have mental slowness.  Feel cold or shivery.  Feel sleepy.  Feel tired.  Feel sore or achy, even in parts of your body where you did not have surgery. This information is not intended to replace advice given to you by your health care provider. Make sure you discuss any questions you have with your health care provider. Document Released: 08/08/2007 Document Revised: 10/12/2015 Document Reviewed: 04/15/2015 Elsevier Interactive  Patient Education  2017 [place] Anesthesia, Adult, Care After These instructions provide you with information about caring for yourself after your procedure. Your health care provider may also give you more specific instructions. Your treatment has been planned according to current medical practices, but problems sometimes occur. Call your health care provider if you have any problems or questions after your procedure. What can I expect after the procedure? After the procedure, it is common to have:  Vomiting.  A sore throat.  Mental slowness. It is common to feel:  Nauseous.  Cold or shivery.  Sleepy.  Tired.  Sore or achy, even in parts of your body where you did not have surgery. Follow these instructions at home: For at least 24 hours after the procedure:  Do not:  Participate in activities where you could fall or become injured.  Drive.  Use heavy machinery.  Drink alcohol.  Take sleeping pills or medicines that cause drowsiness.  Make important decisions or sign legal documents.  Take care of children on your own.  Rest. Eating and drinking  If you vomit, drink water, juice, or soup when you can drink without vomiting.  Drink enough fluid to keep your urine clear or pale yellow.  Make sure you have little or no nausea before eating solid foods.  Follow the diet recommended by your health care provider. General instructions  Have a responsible adult stay with you until you are awake and alert.  Return to your normal activities as told by your health care provider. Ask your health care provider what activities are safe for you.  Take over-the-counter and prescription medicines only as told by your health care provider.  If you smoke, do not smoke without supervision.  Keep all follow-up visits as told by your health care provider. This is important. Contact a health care provider if:  You continue to have nausea or vomiting at home,  and medicines are not helpful.  You cannot drink fluids or start eating again.  You cannot urinate after 8-12 hours.  You develop a skin rash.  You have fever.  You have increasing redness at the site of your procedure. Get help right away if:  You have difficulty breathing.  You have chest pain.  You have unexpected bleeding.  You feel that you are having a life-threatening or urgent problem. This information is not intended to replace advice given to you by your health care provider. Make sure you discuss any questions you have with your health care provider. Document Released: 08/07/2000 Document Revised: 10/04/2015 Document Reviewed: 04/15/2015 Elsevier Interactive Patient Education  2017 [employer].

## 2016-05-10 ENCOUNTER — Other Ambulatory Visit: Payer: Self-pay | Admitting: Obstetrics & Gynecology

## 2016-05-11 ENCOUNTER — Encounter (HOSPITAL_COMMUNITY)
Admission: RE | Admit: 2016-05-11 | Discharge: 2016-05-11 | Disposition: A | Discharge status: Home / Self Care | Payer: Medicaid Other | Source: Ambulatory Visit | Attending: Obstetrics & Gynecology | Admitting: Obstetrics & Gynecology

## 2016-05-11 ENCOUNTER — Encounter (HOSPITAL_COMMUNITY): Payer: Self-pay

## 2016-05-11 DIAGNOSIS — Z01812 Encounter for preprocedural laboratory examination: Secondary | ICD-10-CM | POA: Insufficient documentation

## 2016-05-11 DIAGNOSIS — N938 Other specified abnormal uterine and vaginal bleeding: Secondary | ICD-10-CM | POA: Diagnosis not present

## 2016-05-11 LAB — COMPREHENSIVE METABOLIC PANEL
ALBUMIN: 4.2 g/dL (ref 3.5–5.0)
ALK PHOS: 44 U/L (ref 38–126)
ALT: 16 U/L (ref 14–54)
AST: 13 U/L — AB (ref 15–41)
Anion gap: 6 (ref 5–15)
BILIRUBIN TOTAL: 0.3 mg/dL (ref 0.3–1.2)
BUN: 8 mg/dL (ref 6–20)
CALCIUM: 9.9 mg/dL (ref 8.9–10.3)
CO2: 26 mmol/L (ref 22–32)
CREATININE: 0.99 mg/dL (ref 0.44–1.00)
Chloride: 107 mmol/L (ref 101–111)
GFR calc Af Amer: >60 mL/min (ref >60)
GFR calc non Af Amer: >60 mL/min (ref >60)
Glucose, Bld: 103 mg/dL — ABNORMAL HIGH (ref 65–99)
Potassium: 4.2 mmol/L (ref 3.5–5.1)
Sodium: 139 mmol/L (ref 135–145)
TOTAL PROTEIN: 7.3 g/dL (ref 6.5–8.1)

## 2016-05-11 LAB — CBC
HEMATOCRIT: 44.6 % (ref 36.0–46.0)
HEMOGLOBIN: 14.1 g/dL (ref 12.0–15.0)
MCH: 24.7 pg — ABNORMAL LOW (ref 26.0–34.0)
MCHC: 31.6 g/dL (ref 30.0–36.0)
MCV: 78 fL (ref 78.0–100.0)
Platelets: 323 10*3/uL (ref 150–400)
RBC: 5.72 MIL/uL — ABNORMAL HIGH (ref 3.87–5.11)
RDW: 17.1 % — ABNORMAL HIGH (ref 11.5–15.5)
WBC: 8.4 10*3/uL (ref 4.0–10.5)

## 2016-05-11 LAB — URINALYSIS, ROUTINE W REFLEX MICROSCOPIC
BILIRUBIN URINE: NEGATIVE
GLUCOSE, UA: NEGATIVE mg/dL
Nitrite: POSITIVE — AB
PH: 5.5 (ref 5.0–8.0)
Specific Gravity, Urine: >1.03 — ABNORMAL HIGH (ref 1.005–1.030)

## 2016-05-11 LAB — URINALYSIS, MICROSCOPIC (REFLEX)

## 2016-05-11 LAB — HCG, QUANTITATIVE, PREGNANCY: hCG, Beta Chain, Quant, S: <1 m[IU]/mL (ref <5)

## 2016-05-22 ENCOUNTER — Other Ambulatory Visit (HOSPITAL_COMMUNITY): Payer: Medicaid Other

## 2016-05-30 ENCOUNTER — Encounter: Payer: Medicaid Other | Admitting: Obstetrics & Gynecology

## 2016-05-31 NOTE — Patient Instructions (Signed)
Ann Hoover  05/31/2016     @PREFPERIOPPHARMACY @   Your procedure is scheduled on  06/07/2016   Report to [facility] at  700  A.M.  Call this number if you have problems the morning of surgery:  [phone]   Remember:  Do not eat food or drink liquids after midnight.  Take these medicines the morning of surgery with A SIP OF WATER  Adderall, methadone.   Do not wear jewelry, make-up or nail polish.  Do not wear lotions, powders, or perfumes, or deoderant.  Do not shave 48 hours prior to surgery.  Men may shave face and neck.  Do not bring valuables to the hospital.  [facility] is not responsible for any belongings or valuables.  Contacts, dentures or bridgework may not be worn into surgery.  Leave your suitcase in the car.  After surgery it may be brought to your room.  For patients admitted to the hospital, discharge time will be determined by your treatment team.  Patients discharged the day of surgery will not be allowed to drive home.   Name and phone number of your driver:   family Special instructions:  none  Please read over the following fact sheets that you were given. Anesthesia Post-op Instructions and Care and Recovery After Surgery       Dilation and Curettage or Vacuum Curettage Dilation and curettage (D&C) and vacuum curettage are minor procedures. A D&C involves stretching (dilation) the cervix and scraping (curettage) the inside lining of the uterus (endometrium). During a D&C, tissue is gently scraped from the endometrium, starting from the top portion of the uterus down to the lowest part of the uterus (cervix). During a vacuum curettage, the lining and tissue in the uterus are removed with the use of gentle suction. Curettage may be performed to either diagnose or treat a problem. As a diagnostic procedure, curettage is performed to examine tissues from the uterus. A diagnostic curettage may be done if you have:  Irregular  bleeding in the uterus.  Bleeding with the development of clots.  Spotting between menstrual periods.  Prolonged menstrual periods or other abnormal bleeding.  Bleeding after menopause.  No menstrual period (amenorrhea).  A change in size and shape of the uterus.  Abnormal endometrial cells discovered during a Pap test. As a treatment procedure, curettage may be performed for the following reasons:  Removal of an IUD (intrauterine device).  Removal of retained placenta after giving birth.  Abortion.  Miscarriage.  Removal of endometrial polyps.  Removal of uncommon types of noncancerous lumps (fibroids). Tell a health care provider about:  Any allergies you have, including allergies to prescribed medicine or latex.  All medicines you are taking, including vitamins, herbs, eye drops, creams, and over-the-counter medicines. This is especially important if you take any blood-thinning medicine. Bring a list of all of your medicines to your appointment.  Any problems you or family members have had with anesthetic medicines.  Any blood disorders you have.  Any surgeries you have had.  Your medical history and any medical conditions you have.  Whether you are pregnant or may be pregnant.  Recent vaginal infections you have had.  Recent menstrual periods, bleeding problems you have had, and what form of birth control (contraception) you use. What are the risks? Generally, this is a safe procedure. However, problems may occur, including:  Infection.  Heavy vaginal bleeding.  Allergic reactions to medicines.  Damage to the cervix or other structures or organs.  Development of scar tissue (adhesions) inside the uterus, which can cause abnormal amounts of menstrual bleeding. This may make it harder to get pregnant in the future.  A hole (perforation) or puncture in the uterine wall. This is rare. What happens before the procedure? Staying hydrated  Follow  instructions from your health care provider about hydration, which may include:  Up to 2 hours before the procedure - you may continue to drink clear liquids, such as water, clear fruit juice, black coffee, and plain tea. Eating and drinking restrictions  Follow instructions from your health care provider about eating and drinking, which may include:  8 hours before the procedure - stop eating heavy meals or foods such as meat, fried foods, or fatty foods.  6 hours before the procedure - stop eating light meals or foods, such as toast or cereal.  6 hours before the procedure - stop drinking milk or drinks that contain milk.  2 hours before the procedure - stop drinking clear liquids. If your health care provider told you to take your medicine(s) on the day of your procedure, take them with only a sip of water. Medicines  Ask your health care provider about:  Changing or stopping your regular medicines. This is especially important if you are taking diabetes medicines or blood thinners.  Taking medicines such as aspirin and ibuprofen. These medicines can thin your blood. Do not take these medicines before your procedure if your health care provider instructs you not to.  You may be given antibiotic medicine to help prevent infection. General instructions  For 24 hours before your procedure, do not:  Douche.  Use tampons.  Use medicines, creams, or suppositories in the vagina.  Have sexual intercourse.  You may be given a pregnancy test on the day of the procedure.  Plan to have someone take you home from the hospital or clinic.  You may have a blood or urine sample taken.  If you will be going home right after the procedure, plan to have someone with you for 24 hours. What happens during the procedure?  To reduce your risk of infection:  Your health care team will wash or sanitize their hands.  Your skin will be washed with soap.  An IV tube will be inserted into one  of your veins.  You will be given one of the following:  A medicine that numbs the area in and around the cervix (local anesthetic).  A medicine to make you fall asleep (general anesthetic).  You will lie down on your back, with your feet in foot rests (stirrups).  The size and position of your uterus will be checked.  A lubricated instrument (speculum or Sims retractor) will be inserted into the back side of your vagina. The speculum will be used to hold apart the walls of your vagina so your health care provider can see your cervix.  A tool (tenaculum) will be attached to the lip of the cervix to stabilize it.  Your cervix will be softened and dilated. This may be done by:  Taking a medicine.  Having tapered dilators or thin rods (laminaria) or gradual widening instruments (tapered dilators) inserted into your cervix.  A small, sharp, curved instrument (curette) will be used to scrape a small amount of tissue or cells from the endometrium or cervical canal. In some cases, gentle suction is applied with the curette. The curette will  then be removed. The cells will be taken to a lab for testing. The procedure may vary among health care providers and hospitals. What happens after the procedure?  You may have mild cramping, backache, pain, and light bleeding or spotting. You may pass small blood clots from your vagina.  You may have to wear compression stockings. These stockings help to prevent blood clots and reduce swelling in your legs.  Your blood pressure, heart rate, breathing rate, and blood oxygen level will be monitored until the medicines you were given have worn off. Summary  Dilation and curettage (D&C) involves stretching (dilation) the cervix and scraping (curettage) the inside lining of the uterus (endometrium).  After the procedure, you may have mild cramping, backache, pain, and light bleeding or spotting. You may pass small blood clots from your vagina.  Plan to  have someone take you home from the hospital or clinic. This information is not intended to replace advice given to you by your health care provider. Make sure you discuss any questions you have with your health care provider. Document Released: 05/01/2005 Document Revised: 01/16/2016 Document Reviewed: 01/16/2016 Elsevier Interactive Patient Education  2017 [employer]  Dilation and Curettage or Vacuum Curettage, Care After These instructions give you information about caring for yourself after your procedure. Your doctor may also give you more specific instructions. Call your doctor if you have any problems or questions after your procedure. Follow these instructions at home: Activity  Do not drive or use heavy machinery while taking prescription pain medicine.  For 24 hours after your procedure, avoid driving.  Take short walks often, followed by rest periods. Ask your doctor what activities are safe for you. After one or two days, you may be able to return to your normal activities.  Do not lift anything that is heavier than 10 lb (4.5 kg) until your doctor approves.  For at least 2 weeks, or as long as told by your doctor:  Do not douche.  Do not use tampons.  Do not have sex. General instructions  Take over-the-counter and prescription medicines only as told by your doctor. This is very important if you take blood thinning medicine.  Do not take baths, swim, or use a hot tub until your doctor approves. Take showers instead of baths.  Wear compression stockings as told by your doctor.  It is up to you to get the results of your procedure. Ask your doctor when your results will be ready.  Keep all follow-up visits as told by your doctor. This is important. Contact a doctor if:  You have very bad cramps that get worse or do not get better with medicine.  You have very bad pain in your belly (abdomen).  You cannot drink fluids without throwing up (vomiting).  You get  pain in a different part of the area between your belly and thighs (pelvis).  You have bad-smelling discharge from your vagina.  You have a rash. Get help right away if:  You are bleeding a lot from your vagina. A lot of bleeding means soaking more than one sanitary pad in an hour, for 2 hours in a row.  You have clumps of blood (blood clots) coming from your vagina.  You have a fever or chills.  Your belly feels very tender or hard.  You have chest pain.  You have trouble breathing.  You cough up blood.  You feel dizzy.  You feel light-headed.  You pass out (faint).  You have  pain in your neck or shoulder area. Summary  Take short walks often, followed by rest periods. Ask your doctor what activities are safe for you. After one or two days, you may be able to return to your normal activities.  Do not lift anything that is heavier than 10 lb (4.5 kg) until your doctor approves.  Do not take baths, swim, or use a hot tub until your doctor approves. Take showers instead of baths.  Contact your doctor if you have any symptoms of infection, like bad-smelling discharge from your vagina. This information is not intended to replace advice given to you by your health care provider. Make sure you discuss any questions you have with your health care provider. Document Released: 02/08/2008 Document Revised: 01/17/2016 Document Reviewed: 01/17/2016 Elsevier Interactive Patient Education  2017 [place]. Hysteroscopy Hysteroscopy is a procedure used for looking inside the womb (uterus). It may be done for various reasons, including:  To evaluate abnormal bleeding, fibroid (benign, noncancerous) tumors, polyps, scar tissue (adhesions), and possibly cancer of the uterus.  To look for lumps (tumors) and other uterine growths.  To look for causes of why a woman cannot get pregnant (infertility), causes of recurrent loss of pregnancy (miscarriages), or a lost intrauterine device  (IUD).  To perform a sterilization by blocking the fallopian tubes from inside the uterus. In this procedure, a thin, flexible tube with a tiny light and camera on the end of it (hysteroscope) is used to look inside the uterus. A hysteroscopy should be done right after a menstrual period to be sure you are not pregnant. LET [facility] CARE PROVIDER KNOW ABOUT:   Any allergies you have.  All medicines you are taking, including vitamins, herbs, eye drops, creams, and over-the-counter medicines.  Previous problems you or members of your family have had with the use of anesthetics.  Any blood disorders you have.  Previous surgeries you have had.  Medical conditions you have. RISKS AND COMPLICATIONS  Generally, this is a safe procedure. However, as with any procedure, complications can occur. Possible complications include:  Putting a hole in the uterus.  Excessive bleeding.  Infection.  Damage to the cervix.  Injury to other organs.  Allergic reaction to medicines.  Too much fluid used in the uterus for the procedure. BEFORE THE PROCEDURE   Ask your health care provider about changing or stopping any regular medicines.  Do not take aspirin or blood thinners for 1 week before the procedure, or as directed by your health care provider. These can cause bleeding.  If you smoke, do not smoke for 2 weeks before the procedure.  In some cases, a medicine is placed in the cervix the day before the procedure. This medicine makes the cervix have a larger opening (dilate). This makes it easier for the instrument to be inserted into the uterus during the procedure.  Do not eat or drink anything for at least 8 hours before the surgery.  Arrange for someone to take you home after the procedure. PROCEDURE   You may be given a medicine to relax you (sedative). You may also be given one of the following:  A medicine that numbs the area around the cervix (local anesthetic).  A medicine  that makes you sleep through the procedure (general anesthetic).  The hysteroscope is inserted through the vagina into the uterus. The camera on the hysteroscope sends a picture to a TV screen. This gives the surgeon a good view inside the uterus.  During  the procedure, air or a liquid is put into the uterus, which allows the surgeon to see better.  Sometimes, tissue is gently scraped from inside the uterus. These tissue samples are sent to a lab for testing. AFTER THE PROCEDURE   If you had a general anesthetic, you may be groggy for a couple hours after the procedure.  If you had a local anesthetic, you will be able to go home as soon as you are stable and feel ready.  You may have some cramping. This normally lasts for a couple days.  You may have bleeding, which varies from light spotting for a few days to menstrual-like bleeding for 3-7 days. This is normal.  If your test results are not back during the visit, make an appointment with your health care provider to find out the results. This information is not intended to replace advice given to you by your health care provider. Make sure you discuss any questions you have with your health care provider. Document Released: 08/07/2000 Document Revised: 02/19/2013 Document Reviewed: 11/28/2012 Elsevier Interactive Patient Education  2017 [place]. Hysteroscopy, Care After Refer to this sheet in the next few weeks. These instructions provide you with information on caring for yourself after your procedure. Your health care provider may also give you more specific instructions. Your treatment has been planned according to current medical practices, but problems sometimes occur. Call your health care provider if you have any problems or questions after your procedure.  WHAT TO EXPECT AFTER THE PROCEDURE After your procedure, it is typical to have the following:  You may have some cramping. This normally lasts for a couple days.  You  may have bleeding. This can vary from light spotting for a few days to menstrual-like bleeding for 3-7 days. HOME CARE INSTRUCTIONS  Rest for the first 1-2 days after the procedure.  Only take over-the-counter or prescription medicines as directed by your health care provider. Do not take aspirin. It can increase the chances of bleeding.  Take showers instead of baths for 2 weeks or as directed by your health care provider.  Do not drive for 24 hours or as directed.  Do not drink alcohol while taking pain medicine.  Do not use tampons, douche, or have sexual intercourse for 2 weeks or until your health care provider says it is okay.  Take your temperature twice a day for 4-5 days. Write it down each time.  Follow your health care provider's advice about diet, exercise, and lifting.  If you develop constipation, you may:  Take a mild laxative if your health care provider approves.  Add bran foods to your diet.  Drink enough fluids to keep your urine clear or pale yellow.  Try to have someone with you or available to you for the first 24-48 hours, especially if you were given a general anesthetic.  Follow up with your health care provider as directed. SEEK MEDICAL CARE IF:  You feel dizzy or lightheaded.  You feel sick to your stomach (nauseous).  You have abnormal vaginal discharge.  You have a rash.  You have pain that is not controlled with medicine. SEEK IMMEDIATE MEDICAL CARE IF:  You have bleeding that is heavier than a normal menstrual period.  You have a fever.  You have increasing cramps or pain, not controlled with medicine.  You have new belly (abdominal) pain.  You pass out.  You have pain in the tops of your shoulders (shoulder strap areas).  You have  shortness of breath. This information is not intended to replace advice given to you by your health care provider. Make sure you discuss any questions you have with your health care provider. Document  Released: 02/19/2013 Document Reviewed: 02/19/2013 Elsevier Interactive Patient Education  2017 [place].  Endometrial Ablation Endometrial ablation removes the lining of the uterus (endometrium). It is usually a same-day, outpatient treatment. Ablation helps avoid major surgery, such as surgery to remove the cervix and uterus (hysterectomy). After endometrial ablation, you will have little or no menstrual bleeding and may not be able to have children. However, if you are premenopausal, you will need to use a reliable method of birth control following the procedure because of the small chance that pregnancy can occur. There are different reasons to have this procedure. These reasons include:  Heavy periods.  Bleeding that is causing anemia.  Irregular bleeding.  Bleeding fibroids on the lining inside the uterus if they are smaller than 3 centimeters. This procedure may not be possible for you if:   You want to have children in the future.   You have severe cramps with your menstrual period.   You have precancerous or cancerous cells in your uterus.   You were recently pregnant.   You have gone through menopause.   You have had major surgery on your uterus, resulting in thinning of the uterine wall. Surgeries may include:  The removal of one or more uterine fibroids (myomectomy).  A cesarean section with a classic (vertical) incision on your uterus. Ask your health care provider what type of cesarean you had. Sometimes the scar on your skin is different than the scar on your uterus. Even if you have had surgery on your uterus, certain types of ablation may still be safe for you. Talk with your health care provider. LET [facility] CARE PROVIDER KNOW ABOUT:  Any allergies you have.  All medicines you are taking, including vitamins, herbs, eye drops, creams, and over-the-counter medicines.  Previous problems you or members of your family have had with the use of  anesthetics.  Any blood disorders you have.  Previous surgeries you have had.  Medical conditions you have. RISKS AND COMPLICATIONS  Generally, this is a safe procedure. However, as with any procedure, complications can occur. Possible complications include:  Perforation of the uterus.  Bleeding.  Infection of the uterus, bladder, or vagina.  Injury to surrounding organs.  An air bubble to the lung (air embolus).  Pregnancy following the procedure.  Failure of the procedure to help the problem, requiring hysterectomy.  Decreased ability to diagnose cancer in the lining of the uterus. BEFORE THE PROCEDURE  The lining of the uterus must be tested to make sure there is no pre-cancerous or cancer cells present.  An ultrasound may be performed to look at the size of the uterus and to check for abnormalities.  Medicines may be given to thin the lining of the uterus. PROCEDURE  During the procedure, your health care provider will use a tool called a resectoscope to help see inside your uterus. There are different ways to remove the lining of your uterus.   Radiofrequency - This method uses a radiofrequency-alternating electric current to remove the lining of the uterus.  Cryotherapy - This method uses extreme cold to freeze the lining of the uterus.  Heated-Free Liquid - This method uses heated salt (saline) solution to remove the lining of the uterus.  Microwave - This method uses high-energy microwaves to heat up  the lining of the uterus to remove it.  Thermal balloon - This method involves inserting a catheter with a balloon tip into the uterus. The balloon tip is filled with heated fluid to remove the lining of the uterus. AFTER THE PROCEDURE  After your procedure, do not have sexual intercourse or insert anything into your vagina until permitted by your health care provider. After the procedure, you may experience:  Cramps.  Vaginal discharge.  Frequent  urination. This information is not intended to replace advice given to you by your health care provider. Make sure you discuss any questions you have with your health care provider. Document Released: 03/10/2004 Document Revised: 01/20/2015 Document Reviewed: 10/02/2012 Elsevier Interactive Patient Education  2017 [place] Anesthesia, Adult General anesthesia is the use of medicines to make a person "go to sleep" (be unconscious) for a medical procedure. General anesthesia is often recommended when a procedure:  Is long.  Requires you to be still or in an unusual position.  Is major and can cause you to lose blood.  Is impossible to do without general anesthesia. The medicines used for general anesthesia are called general anesthetics. In addition to making you sleep, the medicines:  Prevent pain.  Control your blood pressure.  Relax your muscles. Tell a health care provider about:  Any allergies you have.  All medicines you are taking, including vitamins, herbs, eye drops, creams, and over-the-counter medicines.  Any problems you or family members have had with anesthetic medicines.  Types of anesthetics you have had in the past.  Any bleeding disorders you have.  Any surgeries you have had.  Any medical conditions you have.  Any history of heart or lung conditions, such as heart failure, sleep apnea, or chronic obstructive pulmonary disease (COPD).  Whether you are pregnant or may be pregnant.  Whether you use tobacco, alcohol, marijuana, or street drugs.  Any history of Armed forces logistics/support/administrative officer.  Any history of depression or anxiety. What are the risks? Generally, this is a safe procedure. However, problems may occur, including:  Allergic reaction to anesthetics.  Lung and heart problems.  Inhaling food or liquids from your stomach into your lungs (aspiration).  Injury to nerves.  Waking up during your procedure and being unable to move  (rare).  Extreme agitation or a state of mental confusion (delirium) when you wake up from the anesthetic.  Air in the bloodstream, which can lead to stroke. These problems are more likely to develop if you are having a major surgery or if you have an advanced medical condition. You can prevent some of these complications by answering all of your health care provider's questions thoroughly and by following all pre-procedure instructions. General anesthesia can cause side effects, including:  Nausea or vomiting  A sore throat from the breathing tube.  Feeling cold or shivery.  Feeling tired, washed out, or achy.  Sleepiness or drowsiness.  Confusion or agitation. What happens before the procedure? Staying hydrated  Follow instructions from your health care provider about hydration, which may include:  Up to 2 hours before the procedure - you may continue to drink clear liquids, such as water, clear fruit juice, black coffee, and plain tea. Eating and drinking restrictions  Follow instructions from your health care provider about eating and drinking, which may include:  8 hours before the procedure - stop eating heavy meals or foods such as meat, fried foods, or fatty foods.  6 hours before the procedure - stop eating light  meals or foods, such as toast or cereal.  6 hours before the procedure - stop drinking milk or drinks that contain milk.  2 hours before the procedure - stop drinking clear liquids. Medicines  Ask your health care provider about:  Changing or stopping your regular medicines. This is especially important if you are taking diabetes medicines or blood thinners.  Taking medicines such as aspirin and ibuprofen. These medicines can thin your blood. Do not take these medicines before your procedure if your health care provider instructs you not to.  Taking new dietary supplements or medicines. Do not take these during the week before your procedure unless your  health care provider approves them.  If you are told to take a medicine or to continue taking a medicine on the day of the procedure, take the medicine with sips of water. General instructions   Ask if you will be going home the same day, the following day, or after a longer hospital stay.  Plan to have someone take you home.  Plan to have someone stay with you for the first 24 hours after you leave the hospital or clinic.  For 3-6 weeks before the procedure, try not to use any tobacco products, such as cigarettes, chewing tobacco, and e-cigarettes.  You may brush your teeth on the morning of the procedure, but make sure to spit out the toothpaste. What happens during the procedure?  You will be given anesthetics through a mask and through an IV tube in one of your veins.  You may receive medicine to help you relax (sedative).  As soon as you are asleep, a breathing tube may be used to help you breathe.  An anesthesia specialist will stay with you throughout the procedure. He or she will help keep you comfortable and safe by continuing to give you medicines and adjusting the amount of medicine that you get. He or she will also watch your blood pressure, pulse, and oxygen levels to make sure that the anesthetics do not cause any problems.  If a breathing tube was used to help you breathe, it will be removed before you wake up. The procedure may vary among health care providers and hospitals. What happens after the procedure?  You will wake up, often slowly, after the procedure is complete, usually in a recovery area.  Your blood pressure, heart rate, breathing rate, and blood oxygen level will be monitored until the medicines you were given have worn off.  You may be given medicine to help you calm down if you feel anxious or agitated.  If you will be going home the same day, your health care provider may check to make sure you can stand, drink, and urinate.  Your health care  providers will treat your pain and side effects before you go home.  Do not drive for 24 hours if you received a sedative.  You may:  Feel nauseous and vomit.  Have a sore throat.  Have mental slowness.  Feel cold or shivery.  Feel sleepy.  Feel tired.  Feel sore or achy, even in parts of your body where you did not have surgery. This information is not intended to replace advice given to you by your health care provider. Make sure you discuss any questions you have with your health care provider. Document Released: 08/08/2007 Document Revised: 10/12/2015 Document Reviewed: 04/15/2015 Elsevier Interactive Patient Education  2017 [place] Anesthesia, Adult, Care After These instructions provide you with information about caring for  yourself after your procedure. Your health care provider may also give you more specific instructions. Your treatment has been planned according to current medical practices, but problems sometimes occur. Call your health care provider if you have any problems or questions after your procedure. What can I expect after the procedure? After the procedure, it is common to have:  Vomiting.  A sore throat.  Mental slowness. It is common to feel:  Nauseous.  Cold or shivery.  Sleepy.  Tired.  Sore or achy, even in parts of your body where you did not have surgery. Follow these instructions at home: For at least 24 hours after the procedure:  Do not:  Participate in activities where you could fall or become injured.  Drive.  Use heavy machinery.  Drink alcohol.  Take sleeping pills or medicines that cause drowsiness.  Make important decisions or sign legal documents.  Take care of children on your own.  Rest. Eating and drinking  If you vomit, drink water, juice, or soup when you can drink without vomiting.  Drink enough fluid to keep your urine clear or pale yellow.  Make sure you have little or no nausea before  eating solid foods.  Follow the diet recommended by your health care provider. General instructions  Have a responsible adult stay with you until you are awake and alert.  Return to your normal activities as told by your health care provider. Ask your health care provider what activities are safe for you.  Take over-the-counter and prescription medicines only as told by your health care provider.  If you smoke, do not smoke without supervision.  Keep all follow-up visits as told by your health care provider. This is important. Contact a health care provider if:  You continue to have nausea or vomiting at home, and medicines are not helpful.  You cannot drink fluids or start eating again.  You cannot urinate after 8-12 hours.  You develop a skin rash.  You have fever.  You have increasing redness at the site of your procedure. Get help right away if:  You have difficulty breathing.  You have chest pain.  You have unexpected bleeding.  You feel that you are having a life-threatening or urgent problem. This information is not intended to replace advice given to you by your health care provider. Make sure you discuss any questions you have with your health care provider. Document Released: 08/07/2000 Document Revised: 10/04/2015 Document Reviewed: 04/15/2015 Elsevier Interactive Patient Education  2017 [employer].

## 2016-06-02 ENCOUNTER — Encounter (HOSPITAL_COMMUNITY): Payer: Self-pay

## 2016-06-02 ENCOUNTER — Encounter (HOSPITAL_COMMUNITY)
Admission: RE | Admit: 2016-06-02 | Discharge: 2016-06-02 | Disposition: A | Discharge status: Home / Self Care | Payer: Medicaid Other | Source: Ambulatory Visit | Attending: Obstetrics & Gynecology | Admitting: Obstetrics & Gynecology

## 2016-06-02 DIAGNOSIS — N920 Excessive and frequent menstruation with regular cycle: Secondary | ICD-10-CM | POA: Diagnosis not present

## 2016-06-02 DIAGNOSIS — Z01812 Encounter for preprocedural laboratory examination: Secondary | ICD-10-CM | POA: Insufficient documentation

## 2016-06-02 LAB — HCG, SERUM, QUALITATIVE: Preg, Serum: NEGATIVE

## 2016-06-02 LAB — URINALYSIS, ROUTINE W REFLEX MICROSCOPIC
Bilirubin Urine: NEGATIVE
Glucose, UA: NEGATIVE mg/dL
Leukocytes, UA: NEGATIVE
Nitrite: POSITIVE — AB
Protein, ur: NEGATIVE mg/dL
Specific Gravity, Urine: >1.03 — ABNORMAL HIGH (ref 1.005–1.030)
pH: 6 (ref 5.0–8.0)

## 2016-06-02 LAB — URINALYSIS, MICROSCOPIC (REFLEX)

## 2016-06-07 ENCOUNTER — Encounter (HOSPITAL_COMMUNITY)
Admission: RE | Disposition: A | Discharge status: Home / Self Care | Payer: Self-pay | Source: Ambulatory Visit | Attending: Obstetrics & Gynecology

## 2016-06-07 ENCOUNTER — Ambulatory Visit (HOSPITAL_COMMUNITY): Payer: Medicaid Other | Admitting: Anesthesiology

## 2016-06-07 ENCOUNTER — Ambulatory Visit (HOSPITAL_COMMUNITY)
Admission: RE | Admit: 2016-06-07 | Discharge: 2016-06-07 | Disposition: A | Discharge status: Home / Self Care | Payer: Medicaid Other | Source: Ambulatory Visit | Attending: Obstetrics & Gynecology | Admitting: Obstetrics & Gynecology

## 2016-06-07 ENCOUNTER — Encounter (HOSPITAL_COMMUNITY): Payer: Self-pay | Admitting: *Deleted

## 2016-06-07 DIAGNOSIS — Z86711 Personal history of pulmonary embolism: Secondary | ICD-10-CM | POA: Diagnosis not present

## 2016-06-07 DIAGNOSIS — N921 Excessive and frequent menstruation with irregular cycle: Secondary | ICD-10-CM | POA: Insufficient documentation

## 2016-06-07 DIAGNOSIS — N946 Dysmenorrhea, unspecified: Secondary | ICD-10-CM | POA: Insufficient documentation

## 2016-06-07 DIAGNOSIS — N920 Excessive and frequent menstruation with regular cycle: Secondary | ICD-10-CM | POA: Diagnosis not present

## 2016-06-07 DIAGNOSIS — Z86718 Personal history of other venous thrombosis and embolism: Secondary | ICD-10-CM | POA: Insufficient documentation

## 2016-06-07 DIAGNOSIS — F17210 Nicotine dependence, cigarettes, uncomplicated: Secondary | ICD-10-CM | POA: Diagnosis not present

## 2016-06-07 HISTORY — PX: DILITATION & CURRETTAGE/HYSTROSCOPY WITH NOVASURE ABLATION: SHX5568

## 2016-06-07 SURGERY — DILATATION & CURETTAGE/HYSTEROSCOPY WITH NOVASURE ABLATION
Anesthesia: General | Site: Vagina

## 2016-06-07 MED ORDER — ONDANSETRON 8 MG PO TBDP: 8.0000 mg | ORAL_TABLET | Freq: Three times a day (TID) | ORAL | 0 refills | Status: DC | PRN

## 2016-06-07 MED ORDER — KETOROLAC TROMETHAMINE 30 MG/ML IJ SOLN
30.0000 mg | Freq: Once | INTRAMUSCULAR | Status: AC
  Administered 2016-06-07: 30 mg via INTRAVENOUS
  Filled 2016-06-07: qty 1

## 2016-06-07 MED ORDER — HYDROMORPHONE HCL 1 MG/ML IJ SOLN
INTRAMUSCULAR | Status: AC
  Filled 2016-06-07: qty 0.5

## 2016-06-07 MED ORDER — ONDANSETRON HCL 4 MG/2ML IJ SOLN
4.0000 mg | Freq: Once | INTRAMUSCULAR | Status: AC
  Administered 2016-06-07: 4 mg via INTRAVENOUS

## 2016-06-07 MED ORDER — LIDOCAINE HCL (CARDIAC) 10 MG/ML IV SOLN
INTRAVENOUS | Status: DC | PRN
  Administered 2016-06-07: 25 mg via INTRAVENOUS
  Administered 2016-06-07: 50 mg via INTRAVENOUS
  Administered 2016-06-07: 25 mg via INTRAVENOUS

## 2016-06-07 MED ORDER — SODIUM CHLORIDE 0.9 % IR SOLN
Status: DC | PRN
Start: 2016-06-07 — End: 2016-06-07
  Administered 2016-06-07: 1000 mL

## 2016-06-07 MED ORDER — MIDAZOLAM HCL 2 MG/2ML IJ SOLN
INTRAMUSCULAR | Status: AC
  Filled 2016-06-07: qty 2

## 2016-06-07 MED ORDER — CEFAZOLIN SODIUM-DEXTROSE 2-4 GM/100ML-% IV SOLN
2.0000 g | INTRAVENOUS | Status: AC
  Administered 2016-06-07: 2 g via INTRAVENOUS
  Filled 2016-06-07: qty 100

## 2016-06-07 MED ORDER — MIDAZOLAM HCL 2 MG/2ML IJ SOLN
0.5000 mg | INTRAMUSCULAR | Status: DC | PRN
  Administered 2016-06-07: 2 mg via INTRAVENOUS

## 2016-06-07 MED ORDER — PROPOFOL 10 MG/ML IV BOLUS
INTRAVENOUS | Status: AC
  Filled 2016-06-07: qty 20

## 2016-06-07 MED ORDER — FENTANYL CITRATE (PF) 100 MCG/2ML IJ SOLN
INTRAMUSCULAR | Status: AC
  Filled 2016-06-07: qty 2

## 2016-06-07 MED ORDER — KETOROLAC TROMETHAMINE 10 MG PO TABS: 10.0000 mg | ORAL_TABLET | Freq: Three times a day (TID) | ORAL | 0 refills | Status: DC | PRN

## 2016-06-07 MED ORDER — HYDROMORPHONE HCL 1 MG/ML IJ SOLN
INTRAMUSCULAR | Status: AC
  Filled 2016-06-07: qty 1

## 2016-06-07 MED ORDER — HYDROMORPHONE HCL 1 MG/ML IJ SOLN: 0.2500 mg | INTRAMUSCULAR | Status: DC | PRN

## 2016-06-07 MED ORDER — PROPOFOL 10 MG/ML IV BOLUS
INTRAVENOUS | Status: DC | PRN
  Administered 2016-06-07: 160 mg via INTRAVENOUS

## 2016-06-07 MED ORDER — ONDANSETRON HCL 4 MG/2ML IJ SOLN
INTRAMUSCULAR | Status: AC
  Filled 2016-06-07: qty 2

## 2016-06-07 MED ORDER — LACTATED RINGERS IV SOLN
INTRAVENOUS | Status: DC
  Administered 2016-06-07: 500 mL via INTRAVENOUS
  Administered 2016-06-07: 08:00:00 via INTRAVENOUS

## 2016-06-07 MED ORDER — LIDOCAINE HCL (PF) 1 % IJ SOLN
INTRAMUSCULAR | Status: AC
  Filled 2016-06-07: qty 5

## 2016-06-07 MED ORDER — HYDROMORPHONE HCL 1 MG/ML IJ SOLN
0.2500 mg | INTRAMUSCULAR | Status: AC | PRN
  Administered 2016-06-07 (×8): 0.5 mg via INTRAVENOUS
  Filled 2016-06-07 (×3): qty 0.5

## 2016-06-07 SURGICAL SUPPLY — 29 items
ABLATOR ENDOMETRIAL BIPOLAR (ABLATOR) ×2 IMPLANT
BAG HAMPER (MISCELLANEOUS) ×2 IMPLANT
CLOTH BEACON ORANGE TIMEOUT ST (SAFETY) ×2 IMPLANT
COVER LIGHT HANDLE STERIS (MISCELLANEOUS) ×4 IMPLANT
FORMALIN 10 PREFIL 120ML (MISCELLANEOUS) ×2 IMPLANT
GAUZE SPONGE 4X4 16PLY XRAY LF (GAUZE/BANDAGES/DRESSINGS) ×2 IMPLANT
GLOVE BIOGEL PI IND STRL 6.5 (GLOVE) ×1 IMPLANT
GLOVE BIOGEL PI IND STRL 8 (GLOVE) ×1 IMPLANT
GLOVE BIOGEL PI INDICATOR 6.5 (GLOVE) ×1
GLOVE BIOGEL PI INDICATOR 8 (GLOVE) ×1
GLOVE ECLIPSE 8.0 STRL XLNG CF (GLOVE) ×2 IMPLANT
GLOVE EXAM NITRILE MD LF STRL (GLOVE) ×2 IMPLANT
GLOVE SURG SS PI 6.5 STRL IVOR (GLOVE) ×2 IMPLANT
GOWN STRL REUS W/TWL LRG LVL3 (GOWN DISPOSABLE) ×2 IMPLANT
GOWN STRL REUS W/TWL XL LVL3 (GOWN DISPOSABLE) ×2 IMPLANT
INST SET HYSTEROSCOPY (KITS) ×2 IMPLANT
IV NS 1000ML (IV SOLUTION) ×1
IV NS 1000ML BAXH (IV SOLUTION) ×1 IMPLANT
KIT ROOM TURNOVER AP CYSTO (KITS) ×2 IMPLANT
MANIFOLD NEPTUNE II (INSTRUMENTS) ×2 IMPLANT
NS IRRIG 1000ML POUR BTL (IV SOLUTION) ×2 IMPLANT
PACK BASIC III (CUSTOM PROCEDURE TRAY) ×1
PACK SRG BSC III STRL LF ECLPS (CUSTOM PROCEDURE TRAY) ×1 IMPLANT
PAD ARMBOARD 7.5X6 YLW CONV (MISCELLANEOUS) ×2 IMPLANT
PAD TELFA 3X4 1S STER (GAUZE/BANDAGES/DRESSINGS) ×2 IMPLANT
SET BASIN LINEN APH (SET/KITS/TRAYS/PACK) ×2 IMPLANT
SET IRRIG Y TYPE TUR BLADDER L (SET/KITS/TRAYS/PACK) ×2 IMPLANT
SHEET LAVH (DRAPES) ×2 IMPLANT
YANKAUER SUCT BULB TIP 10FT TU (MISCELLANEOUS) ×2 IMPLANT

## 2016-06-07 NOTE — Discharge Instructions (Signed)
Endometrial Ablation °Endometrial ablation removes the lining of the uterus (endometrium). It is usually a same-day, outpatient treatment. Ablation helps avoid major surgery, such as surgery to remove the cervix and uterus (hysterectomy). After endometrial ablation, you will have little or no menstrual bleeding and may not be able to have children. However, if you are premenopausal, you will need to use a reliable method of birth control following the procedure because of the small chance that pregnancy can occur. °There are different reasons to have this procedure. These reasons include: °· Heavy periods. °· Bleeding that is causing anemia. °· Irregular bleeding. °· Bleeding fibroids on the lining inside the uterus if they are smaller than 3 centimeters. °This procedure may not be possible for you if:  °· You want to have children in the future.   °· You have severe cramps with your menstrual period.   °· You have precancerous or cancerous cells in your uterus.   °· You were recently pregnant.   °· You have gone through menopause.   °· You have had major surgery on your uterus, resulting in thinning of the uterine wall. Surgeries may include: °¨ The removal of one or more uterine fibroids (myomectomy). °¨ A cesarean section with a classic (vertical) incision on your uterus. Ask your health care provider what type of cesarean you had. Sometimes the scar on your skin is different than the scar on your uterus. °Even if you have had surgery on your uterus, certain types of ablation may still be safe for you. Talk with your health care provider. °LET YOUR HEALTH CARE PROVIDER KNOW ABOUT: °· Any allergies you have. °· All medicines you are taking, including vitamins, herbs, eye drops, creams, and over-the-counter medicines. °· Previous problems you or members of your family have had with the use of anesthetics. °· Any blood disorders you have. °· Previous surgeries you have had. °· Medical conditions you have. °RISKS AND  COMPLICATIONS  °Generally, this is a safe procedure. However, as with any procedure, complications can occur. Possible complications include: °· Perforation of the uterus. °· Bleeding. °· Infection of the uterus, bladder, or vagina. °· Injury to surrounding organs. °· An air bubble to the lung (air embolus). °· Pregnancy following the procedure. °· Failure of the procedure to help the problem, requiring hysterectomy. °· Decreased ability to diagnose cancer in the lining of the uterus. °BEFORE THE PROCEDURE °· The lining of the uterus must be tested to make sure there is no pre-cancerous or cancer cells present. °· An ultrasound may be performed to look at the size of the uterus and to check for abnormalities. °· Medicines may be given to thin the lining of the uterus. °PROCEDURE  °During the procedure, your health care provider will use a tool called a resectoscope to help see inside your uterus. There are different ways to remove the lining of your uterus.  °· Radiofrequency - This method uses a radiofrequency-alternating electric current to remove the lining of the uterus. °· Cryotherapy - This method uses extreme cold to freeze the lining of the uterus. °· Heated-Free Liquid - This method uses heated salt (saline) solution to remove the lining of the uterus. °· Microwave - This method uses high-energy microwaves to heat up the lining of the uterus to remove it. °· Thermal balloon - This method involves inserting a catheter with a balloon tip into the uterus. The balloon tip is filled with heated fluid to remove the lining of the uterus. °AFTER THE PROCEDURE  °After your procedure, do   not have sexual intercourse or insert anything into your vagina until permitted by your health care provider. After the procedure, you may experience:  Cramps.  Vaginal discharge.  Frequent urination. This information is not intended to replace advice given to you by your health care provider. Make sure you discuss any  questions you have with your health care provider. Document Released: 03/10/2004 Document Revised: 01/20/2015 Document Reviewed: 10/02/2012 Elsevier Interactive Patient Education  2017 [employer]   PATIENT INSTRUCTIONS POST-ANESTHESIA  IMMEDIATELY FOLLOWING SURGERY:  Do not drive or operate machinery for the first twenty four hours after surgery.  Do not make any important decisions for twenty four hours after surgery or while taking narcotic pain medications or sedatives.  If you develop intractable nausea and vomiting or a severe headache please notify your doctor immediately.  FOLLOW-UP:  Please make an appointment with your surgeon as instructed. You do not need to follow up with anesthesia unless specifically instructed to do so.  WOUND CARE INSTRUCTIONS (if applicable):  Keep a dry clean dressing on the anesthesia/puncture wound site if there is drainage.  Once the wound has quit draining you may leave it open to air.  Generally you should leave the bandage intact for twenty four hours unless there is drainage.  If the epidural site drains for more than 36-48 hours please call the anesthesia department.  QUESTIONS?:  Please feel free to call your physician or the hospital operator if you have any questions, and they will be happy to assist you.

## 2016-06-07 NOTE — Op Note (Signed)
Preoperative diagnosis: Menometrorrhagia                                        Dysmenorrhea                                          Postoperative diagnoses: Same as above   Procedure: Hysteroscopy, uterine curettage, endometrial ablation using Novasure  Surgeon: Florian Buff   Anesthesia: Laryngeal mask airway  Findings: The endometrium was normal. There were no fibroid or other abnormalities.  Description of operation: The patient was taken to the operating room and placed in the supine position. She underwent general anesthesia using the laryngeal mask airway. She was placed in the dorsal lithotomy position and prepped and draped in the usual sterile fashion. A Graves speculum was placed and the anterior cervical lip was grasped with a single-tooth tenaculum. The cervix was dilated serially to allow passage of the hysteroscope. Diagnostic hysteroscopy was performed and was found to be normal. A vigorous uterine curettage was then performed and all tissue sent to pathology for evaluation.  I then proceeded to perform the Novasure endometrial ablation.  The cervical length was 3.0. The uterus sounded to  9.5 cm yielding a net length of 6.5 cm.  The endometrial cavity was 4.5 cm wide. The power was 161 watts.  The total time of therapy was 1 min 10 seconds. The array was evaluated after the procedure and tissue was adherent on all the dimensions of the surface, confirming fundal treatment as well.    All of the equipment worked well throughout the procedure.  The patient was awakened from anesthesia and taken to the recovery room in good stable condition all counts were correct. She received 2 g of Ancef and 30 mg of Toradol preoperatively. She will be discharged from the recovery room and followed up in the office in 1- 2 weeks.  Chelli Yerkes H 06/07/2016 9:09 AM

## 2016-06-07 NOTE — Transfer of Care (Signed)
Immediate Anesthesia Transfer of Care Note  Patient: [place]  Procedure(s) Performed: Procedure(s): DILATATION & CURETTAGE/HYSTEROSCOPY WITH NOVASURE ABLATION (N/A)  Patient Location: PACU  Anesthesia Type:General  Level of Consciousness: awake, alert  and oriented  Airway & Oxygen Therapy: Patient Spontanous Breathing and Patient connected to face mask oxygen  Post-op Assessment: Report given to RN  Post vital signs: Reviewed and stable  Last Vitals:  Vitals:   06/07/16 0810 06/07/16 0815  BP: 136/73 130/71  Resp: 17 20  Temp:      Last Pain:  Vitals:   06/07/16 0713  TempSrc: Oral  PainSc: 3       Patients Stated Pain Goal: 6 ([id number])  Complications: No apparent anesthesia complications

## 2016-06-07 NOTE — Progress Notes (Signed)
Awake. Crying. Continues c/o postop abd cramping and discomfort. Rates pain 8. States pain med non-effective. Will notify Dr Patsey Berthold.

## 2016-06-07 NOTE — Anesthesia Postprocedure Evaluation (Signed)
Anesthesia Post Note  Patient: Ann Hoover  Procedure(s) Performed: Procedure(s) (LRB): DILATATION & CURETTAGE/HYSTEROSCOPY WITH NOVASURE ABLATION (N/A)  Patient location during evaluation: Short Stay Anesthesia Type: General Level of consciousness: awake and alert and oriented Pain management: satisfactory to patient Vital Signs Assessment: post-procedure vital signs reviewed and stable Respiratory status: spontaneous breathing Cardiovascular status: blood pressure returned to baseline Postop Assessment: no signs of nausea or vomiting Anesthetic complications: no     Last Vitals:  Vitals:   06/07/16 1042 06/07/16 1045  BP:  140/84  Pulse: 78 75  Resp: 11 18  Temp:  36.8 C    Last Pain:  Vitals:   06/07/16 1045  TempSrc: Oral  PainSc: 7                  Ann Hoover

## 2016-06-07 NOTE — Anesthesia Preprocedure Evaluation (Signed)
Anesthesia Evaluation  Patient identified by MRN, date of birth, ID band Patient awake    Reviewed: Allergy & Precautions, NPO status , Patient's Chart, lab work & pertinent test results  Airway Mallampati: II  TM Distance: >3 FB     Dental  (+) Poor Dentition   Pulmonary Current Smoker, PE   breath sounds clear to auscultation       Cardiovascular + DVT   Rhythm:Regular Rate:Normal     Neuro/Psych    GI/Hepatic negative GI ROS,   Endo/Other    Renal/GU      Musculoskeletal   Abdominal   Peds  Hematology   Anesthesia Other Findings   Reproductive/Obstetrics                             Anesthesia Physical Anesthesia Plan  ASA: II  Anesthesia Plan: General   Post-op Pain Management:    Induction: Intravenous  Airway Management Planned: LMA  Additional Equipment:   Intra-op Plan:   Post-operative Plan: Extubation in OR  Informed Consent: I have reviewed the patients History and Physical, chart, labs and discussed the procedure including the risks, benefits and alternatives for the proposed anesthesia with the patient or authorized representative who has indicated his/her understanding and acceptance.     Plan Discussed with:   Anesthesia Plan Comments:         Anesthesia Quick Evaluation

## 2016-06-07 NOTE — Addendum Note (Signed)
Addendum  created 06/07/16 1250 by Ollen Bowl, CRNA   Anesthesia Intra Flowsheets edited

## 2016-06-07 NOTE — Anesthesia Procedure Notes (Addendum)
Procedure Name: LMA Insertion Date/Time: 06/07/2016 8:41 AM Performed by: Tressie Stalker E Pre-anesthesia Checklist: Patient identified, Patient being monitored, Emergency Drugs available, Timeout performed and Suction available Patient Re-evaluated:Patient Re-evaluated prior to inductionOxygen Delivery Method: Circle System Utilized Preoxygenation: Pre-oxygenation with 100% oxygen Intubation Type: IV induction Ventilation: Mask ventilation without difficulty LMA: LMA inserted LMA Size: 3.0 Number of attempts: 1 Placement Confirmation: positive ETCO2 and breath sounds checked- equal and bilateral Comments: Initially tried inserting LMA 4, mouth opening fairly small so went to a #3 without difficulty and good seal obtained.

## 2016-06-07 NOTE — Anesthesia Postprocedure Evaluation (Signed)
Anesthesia Post Note  Patient: TERRIANA KNOFF Late Entry for 1035 Procedure(s) Performed: Procedure(s) (LRB): DILATATION & CURETTAGE/HYSTEROSCOPY WITH NOVASURE ABLATION (N/A)  Patient location during evaluation: PACU Anesthesia Type: General Level of consciousness: awake and alert and oriented Pain management: pain level controlled (Multiple doses of Dilaudid for pain relief) Vital Signs Assessment: post-procedure vital signs reviewed and stable Respiratory status: spontaneous breathing Cardiovascular status: stable Postop Assessment: no signs of nausea or vomiting Anesthetic complications: no     Last Vitals:  Vitals:   06/07/16 1042 06/07/16 1045  BP:  140/84  Pulse: 78 75  Resp: 11 18  Temp:  36.8 C    Last Pain:  Vitals:   06/07/16 1045  TempSrc: Oral  PainSc: 7                  ADAMS, AMY A

## 2016-06-07 NOTE — Progress Notes (Signed)
Dr Patsey Berthold notified of increased pain. Order given.

## 2016-06-07 NOTE — H&P (Signed)
Preoperative History and Physical  Ann Hoover is a 36 y.o. [phone] with No LMP recorded. admitted for a hysteroscopy uterine curettage endometrial ablation.  Pt with almost constant bleeding and cramping somewhat improved on megestrol but not managed effectively sonogram is normal  PMH:        Past Medical History:  Diagnosis Date  . DVT (deep venous thrombosis) ([facility])   . Herniated disc   . Pulmonary embolus ([facility])     PSH:    History reviewed. No pertinent surgical history.  POb/GynH:              OB History    Gravida Para Term Preterm AB Living   8 7 7  0 0 7   SAB TAB Ectopic Multiple Live Births   0 0 0 0        SH:        Social History  Substance Use Topics  . Smoking status: Current Every Day Smoker    Packs/day: 1.00    Years: 12.00    Types: Cigarettes  . Smokeless tobacco: Never Used  . Alcohol use No    FH:    Family History  Problem Relation Age of Onset  . Hyperlipidemia Mother   . Diabetes Father   . Cancer Father     Hodkins  . COPD Father   . Cirrhosis Father   . Pancreatic cancer Maternal Grandmother   . Colon cancer Paternal Grandmother      Allergies: No Known Allergies  Medications:       Current Outpatient Prescriptions:  .  amphetamine-dextroamphetamine (ADDERALL) 20 MG tablet, Take 20 mg by mouth 2 (two) times daily., Disp: , Rfl:  .  megestrol (MEGACE) 40 MG tablet, 3 tablets a day for 5 days, 2 tablets a day for 5 days then 1 tablet daily, Disp: 45 tablet, Rfl: 3 .  methadone (DOLOPHINE) 10 MG tablet, Take 10 mg by mouth 2 (two) times daily., Disp: , Rfl: 0  Review of Systems:   Review of Systems  Constitutional: Negative for fever, chills, weight loss, malaise/fatigue and diaphoresis.  HENT: Negative for hearing loss, ear pain, nosebleeds, congestion, sore throat, neck pain, tinnitus and ear discharge.   Eyes: Negative for blurred vision, double vision, photophobia, pain, discharge  and redness.  Respiratory: Negative for cough, hemoptysis, sputum production, shortness of breath, wheezing and stridor.   Cardiovascular: Negative for chest pain, palpitations, orthopnea, claudication, leg swelling and PND.  Gastrointestinal: Positive for abdominal pain. Negative for heartburn, nausea, vomiting, diarrhea, constipation, blood in stool and melena.  Genitourinary: Negative for dysuria, urgency, frequency, hematuria and flank pain.  Musculoskeletal: Negative for myalgias, back pain, joint pain and falls.  Skin: Negative for itching and rash.  Neurological: Negative for dizziness, tingling, tremors, sensory change, speech change, focal weakness, seizures, loss of consciousness, weakness and headaches.  Endo/Heme/Allergies: Negative for environmental allergies and polydipsia. Does not bruise/bleed easily.  Psychiatric/Behavioral: Negative for depression, suicidal ideas, hallucinations, memory loss and substance abuse. The patient is not nervous/anxious and does not have insomnia.      PHYSICAL EXAM:  Blood pressure 120/80, pulse 78, height 5\' 8"  (1.727 m), weight 197 lb (89.4 kg), unknown if currently breastfeeding.    Vitals reviewed. Constitutional: She is oriented to person, place, and time. She appears well-developed and well-nourished.  HENT:  Head: Normocephalic and atraumatic.  Right Ear: External ear normal.  Left Ear: External ear normal.  Nose: Nose normal.  Mouth/Throat: Oropharynx is clear and  moist.  Eyes: Conjunctivae and EOM are normal. Pupils are equal, round, and reactive to light. Right eye exhibits no discharge. Left eye exhibits no discharge. No scleral icterus.  Neck: Normal range of motion. Neck supple. No tracheal deviation present. No thyromegaly present.  Cardiovascular: Normal rate, regular rhythm, normal heart sounds and intact distal pulses.  Exam reveals no gallop and no friction rub.   No murmur heard. Respiratory: Effort normal and breath  sounds normal. No respiratory distress. She has no wheezes. She has no rales. She exhibits no tenderness.  GI: Soft. Bowel sounds are normal. She exhibits no distension and no mass. There is tenderness. There is no rebound and no guarding.  Genitourinary:       Vulva is normal without lesions Vagina is pink moist without discharge Cervix normal in appearance and pap is normal Uterus is normal size, contour, position, consistency, mobility, non-tender Adnexa is negative with normal sized ovaries by sonogram  Musculoskeletal: Normal range of motion. She exhibits no edema and no tenderness.  Neurological: She is alert and oriented to person, place, and time. She has normal reflexes. She displays normal reflexes. No cranial nerve deficit. She exhibits normal muscle tone. Coordination normal.  Skin: Skin is warm and dry. No rash noted. No erythema. No pallor.  Psychiatric: She has a normal mood and affect. Her behavior is normal. Judgment and thought content normal.    Labs: No results found for this or any previous visit (from the past 336 hour(s)).  EKG:    Orders placed or performed during the hospital encounter of 11/01/14  . ED EKG  . ED EKG  . EKG 12-Lead  . EKG 12-Lead  . EKG    Imaging Studies: ImagingResults  No results found.      Assessment: Menometrorrhagia dysmenorrhea   Plan: Hysteroscopy uterine curettage endometrial ablation 06/08/2015  Roselle Norton H 06/07/2016 8:10 AM

## 2016-06-08 ENCOUNTER — Encounter (HOSPITAL_COMMUNITY): Payer: Self-pay | Admitting: Obstetrics & Gynecology

## 2016-06-20 ENCOUNTER — Encounter: Payer: Self-pay | Admitting: *Deleted

## 2016-06-20 ENCOUNTER — Encounter: Payer: Medicaid Other | Admitting: Obstetrics & Gynecology

## 2017-02-12 ENCOUNTER — Emergency Department (HOSPITAL_COMMUNITY)
Admission: EM | Admit: 2017-02-12 | Discharge: 2017-02-12 | Disposition: A | Discharge status: Home / Self Care | Payer: Medicaid Other | Attending: Emergency Medicine | Admitting: Emergency Medicine

## 2017-02-12 ENCOUNTER — Emergency Department (HOSPITAL_COMMUNITY): Payer: Medicaid Other

## 2017-02-12 ENCOUNTER — Encounter (HOSPITAL_COMMUNITY): Payer: Self-pay | Admitting: Emergency Medicine

## 2017-02-12 DIAGNOSIS — R079 Chest pain, unspecified: Secondary | ICD-10-CM | POA: Diagnosis present

## 2017-02-12 DIAGNOSIS — R091 Pleurisy: Secondary | ICD-10-CM

## 2017-02-12 DIAGNOSIS — Z79899 Other long term (current) drug therapy: Secondary | ICD-10-CM | POA: Insufficient documentation

## 2017-02-12 DIAGNOSIS — F17210 Nicotine dependence, cigarettes, uncomplicated: Secondary | ICD-10-CM | POA: Diagnosis not present

## 2017-02-12 LAB — D-DIMER, QUANTITATIVE (NOT AT ARMC): D DIMER QUANT: 0.33 ug{FEU}/mL (ref 0.00–0.50)

## 2017-02-12 LAB — CBC WITH DIFFERENTIAL/PLATELET
BASOS PCT: 1 %
Basophils Absolute: 0 10*3/uL (ref 0.0–0.1)
EOS ABS: 0.2 10*3/uL (ref 0.0–0.7)
Eosinophils Relative: 4 %
HCT: 41.2 % (ref 36.0–46.0)
HEMOGLOBIN: 13.3 g/dL (ref 12.0–15.0)
LYMPHS ABS: 2.3 10*3/uL (ref 0.7–4.0)
Lymphocytes Relative: 41 %
MCH: 27.1 pg (ref 26.0–34.0)
MCHC: 32.3 g/dL (ref 30.0–36.0)
MCV: 83.9 fL (ref 78.0–100.0)
Monocytes Absolute: 0.3 10*3/uL (ref 0.1–1.0)
Monocytes Relative: 6 %
Neutro Abs: 2.7 10*3/uL (ref 1.7–7.7)
Neutrophils Relative %: 48 %
Platelets: 224 10*3/uL (ref 150–400)
RBC: 4.91 MIL/uL (ref 3.87–5.11)
RDW: 14 % (ref 11.5–15.5)
WBC: 5.6 10*3/uL (ref 4.0–10.5)

## 2017-02-12 LAB — BASIC METABOLIC PANEL
Anion gap: 10 (ref 5–15)
BUN: 9 mg/dL (ref 6–20)
CHLORIDE: 104 mmol/L (ref 101–111)
CO2: 26 mmol/L (ref 22–32)
Calcium: 9.3 mg/dL (ref 8.9–10.3)
Creatinine, Ser: 0.81 mg/dL (ref 0.44–1.00)
GFR calc non Af Amer: >60 mL/min (ref >60)
Glucose, Bld: 96 mg/dL (ref 65–99)
POTASSIUM: 4.1 mmol/L (ref 3.5–5.1)
SODIUM: 140 mmol/L (ref 135–145)

## 2017-02-12 MED ORDER — NAPROXEN 500 MG PO TABS: 500.0000 mg | ORAL_TABLET | Freq: Two times a day (BID) | ORAL | 0 refills | Status: DC

## 2017-02-12 MED ORDER — KETOROLAC TROMETHAMINE 30 MG/ML IJ SOLN
30.0000 mg | Freq: Once | INTRAMUSCULAR | Status: AC
  Administered 2017-02-12: 30 mg via INTRAVENOUS
  Filled 2017-02-12: qty 1

## 2017-02-12 NOTE — Discharge Instructions (Signed)
No abnormalities were found on your testing today. No pneumonia, no blood clot. Pleurisy can take several weeks if not months to resolve. Take the prescribed anti-inflammatory medicine until symptoms improving.

## 2017-02-12 NOTE — ED Triage Notes (Addendum)
[  pt c/o r rib pain radiating around to back. Seen pcp and dx with pleurisy and given meds. States still hurting. Denies cough. nad at this time. Also hx of PE in past. Recent D&C

## 2017-02-12 NOTE — ED Provider Notes (Signed)
[facility] DEPT Provider Note   CSN: [id number] Arrival date & time: 02/12/17  1049     History   Chief Complaint Chief Complaint  Patient presents with  . Back Pain    HPI Ann Hoover is a 36 y.o. female. CC: Right-sided rib and chest pain.  HPI:  This is a 36 y/o female. Has had right-sided chest and rib pain for the last few months. States she saw her physician for it in June and was given Relafen. She states that helps but it "makes me sleep". 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 Was integrated for 6 months. Is not currently anticoagulate. States the pain feels different. It is pleuritic. She is not short of breath. She does not have cough. No difficulty with exertion or exertional capacity. No dyspnea on exertion. No fever. No additional symptoms.  Current hormone use. She does smoke. Family history of DVT PE.  Past Medical History:  Diagnosis Date  . DVT (deep venous thrombosis) ([place])   . Herniated disc   . Pulmonary embolus ([facility])     There are no active problems to display for this patient.   Past Surgical History:  Procedure Laterality Date  . DILITATION & CURRETTAGE/HYSTROSCOPY WITH NOVASURE ABLATION N/A 06/07/2016   Procedure: DILATATION & CURETTAGE/HYSTEROSCOPY WITH NOVASURE ABLATION;  Surgeon: Florian Buff, MD;  Location: [facility];  Service: Gynecology;  Laterality: N/A;  . TUBAL LIGATION      OB History    Gravida Para Term Preterm AB Living   8 7 7  0 0 7   SAB TAB Ectopic Multiple Live Births   0 0 0 0         Home Medications    Prior to Admission medications   Medication Sig Start Date End Date Taking? Authorizing Provider  amphetamine-dextroamphetamine (ADDERALL) 20 MG tablet Take 20 mg by mouth 2 (two) times daily.    [provider]  ketorolac (TORADOL) 10 MG tablet Take 1 tablet (10 mg total) by mouth every 8 (eight) hours as needed. 06/07/16   Florian Buff, MD   methadone (DOLOPHINE) 10 MG tablet Take 10 mg by mouth 2 (two) times daily. 05/15/14   [provider]  naproxen (NAPROSYN) 500 MG tablet Take 1 tablet (500 mg total) by mouth 2 (two) times daily. 02/12/17   Tanna Furry, MD  naproxen sodium (ANAPROX) 220 MG tablet Take 220 mg by mouth daily as needed (pain).    [provider]  ondansetron (ZOFRAN ODT) 8 MG disintegrating tablet Take 1 tablet (8 mg total) by mouth every 8 (eight) hours as needed for nausea or vomiting. 06/07/16   Florian Buff, MD    Family History Family History  Problem Relation Age of Onset  . Hyperlipidemia Mother   . Diabetes Father   . Cancer Father        Hodkins  . COPD Father   . Cirrhosis Father   . Pancreatic cancer Maternal Grandmother   . Colon cancer Paternal Grandmother     Social History Social History  Substance Use Topics  . Smoking status: Current Every Day Smoker    Packs/day: 1.50    Years: 12.00    Types: Cigarettes  . Smokeless tobacco: Never Used  . Alcohol use Yes     Comment: occasional     Allergies   Patient has no allergy information on record.  Review of Systems Review of Systems  Constitutional: Negative for appetite change, chills, diaphoresis, fatigue and fever.  HENT: Negative for mouth sores, sore throat and trouble swallowing.   Eyes: Negative for visual disturbance.  Respiratory: Negative for cough, chest tightness, shortness of breath and wheezing.   Cardiovascular: Positive for chest pain.  Gastrointestinal: Negative for abdominal distention, abdominal pain, diarrhea, nausea and vomiting.  Endocrine: Negative for polydipsia, polyphagia and polyuria.  Genitourinary: Negative for dysuria, frequency and hematuria.  Musculoskeletal: Negative for gait problem.  Skin: Negative for color change, pallor and rash.  Neurological: Negative for dizziness, syncope, light-headedness and headaches.  Hematological: Does not bruise/bleed easily.    Psychiatric/Behavioral: Negative for behavioral problems and confusion.     Physical Exam Updated Vital Signs BP (!) 133/91 (BP Location: Right Arm)   Pulse 66   Temp 98 F (36.7 C) (Oral)   Resp 16   Ht 5\' 8"  (1.727 m)   LMP 06/20/2016   SpO2 100%   Physical Exam  Constitutional: She is oriented to person, place, and time. She appears well-developed and well-nourished. No distress.  HENT:  Head: Normocephalic.  Eyes: Pupils are equal, round, and reactive to light. Conjunctivae are normal. No scleral icterus.  Neck: Normal range of motion. Neck supple. No thyromegaly present.  Cardiovascular: Normal rate and regular rhythm.  Exam reveals no gallop and no friction rub.   No murmur heard. Pulmonary/Chest: Effort normal and breath sounds normal. No respiratory distress. She has no wheezes. She has no rales.  Clear bilateral breath sounds. No pleural pericardial friction rub. Nontender across the chest wall. No vesicles or skin tenderness to suggest zoster. Not tachycardic. Heart rate 65 in the room at rest. Pulse ox 99%.  Abdominal: Soft. Bowel sounds are normal. She exhibits no distension. There is no tenderness. There is no rebound.  Musculoskeletal: Normal range of motion.  Neurological: She is alert and oriented to person, place, and time.  Skin: Skin is warm and dry. No rash noted.  Psychiatric: She has a normal mood and affect. Her behavior is normal.     ED Treatments / Results  Labs (all labs ordered are listed, but only abnormal results are displayed) Labs Reviewed  CBC WITH DIFFERENTIAL/PLATELET  BASIC METABOLIC PANEL  D-DIMER, QUANTITATIVE (NOT AT [facility])    EKG  EKG Interpretation None       Radiology Dg Chest 2 View  Result Date: 02/12/2017 CLINICAL DATA:  Right-sided chest pain on and off for 2 months. EXAM: CHEST  2 VIEW COMPARISON:  Chest CT 07/03/2010 FINDINGS: The cardiac silhouette, mediastinal and hilar contours are normal. The lungs are clear. No  pleural effusion. No pulmonary lesions. The bony thorax is intact. IMPRESSION: Normal chest x-ray. Electronically Signed   By: Marijo Sanes M.D.   On: 02/12/2017 13:48    Procedures Procedures (including critical care time)  Medications Ordered in ED Medications  ketorolac (TORADOL) 30 MG/ML injection 30 mg (30 mg Intravenous Given 02/12/17 1328)     Initial Impression / Assessment and Plan / ED Course  I have reviewed the triage vital signs and the nursing notes.  Pertinent labs & imaging results that were available during my care of the patient were reviewed by me and considered in my medical decision making (see chart for details).    Only risk factor for recurrent DVT or PE is smoking. Has 1 previous provoked DVT/PE. PERC is 1+ for Prior PE. WElls is 1.5 for prior, which is still low  risk. We will start with d-dimer. Further testing studies based on value.  Final Clinical Impressions(s) / ED Diagnoses   Final diagnoses:  Pleurisy    New Prescriptions Discharge Medication List as of 02/12/2017  2:00 PM    START taking these medications   Details  naproxen (NAPROSYN) 500 MG tablet Take 1 tablet (500 mg total) by mouth 2 (two) times daily., Starting Mon 02/12/2017, Print         Tanna Furry, MD 02/13/17 [phone]

## 2017-03-10 ENCOUNTER — Emergency Department (HOSPITAL_COMMUNITY)
Admission: EM | Admit: 2017-03-10 | Discharge: 2017-03-10 | Disposition: A | Discharge status: Home / Self Care | Payer: Medicaid Other | Attending: Emergency Medicine | Admitting: Emergency Medicine

## 2017-03-10 ENCOUNTER — Encounter (HOSPITAL_COMMUNITY): Payer: Self-pay | Admitting: Emergency Medicine

## 2017-03-10 DIAGNOSIS — F40298 Other specified phobia: Secondary | ICD-10-CM | POA: Insufficient documentation

## 2017-03-10 DIAGNOSIS — R110 Nausea: Secondary | ICD-10-CM | POA: Diagnosis not present

## 2017-03-10 DIAGNOSIS — R51 Headache: Secondary | ICD-10-CM | POA: Diagnosis present

## 2017-03-10 DIAGNOSIS — Z79899 Other long term (current) drug therapy: Secondary | ICD-10-CM | POA: Diagnosis not present

## 2017-03-10 DIAGNOSIS — G44209 Tension-type headache, unspecified, not intractable: Secondary | ICD-10-CM

## 2017-03-10 DIAGNOSIS — F17210 Nicotine dependence, cigarettes, uncomplicated: Secondary | ICD-10-CM | POA: Diagnosis not present

## 2017-03-10 MED ORDER — METHOCARBAMOL 500 MG PO TABS
1000.0000 mg | ORAL_TABLET | Freq: Once | ORAL | Status: AC
  Administered 2017-03-10: 1000 mg via ORAL
  Filled 2017-03-10: qty 2

## 2017-03-10 MED ORDER — KETOROLAC TROMETHAMINE 60 MG/2ML IM SOLN
60.0000 mg | Freq: Once | INTRAMUSCULAR | Status: AC
  Administered 2017-03-10: 60 mg via INTRAMUSCULAR
  Filled 2017-03-10: qty 2

## 2017-03-10 MED ORDER — METOCLOPRAMIDE HCL 10 MG PO TABS: 10.0000 mg | ORAL_TABLET | Freq: Four times a day (QID) | ORAL | 0 refills | Status: DC | PRN

## 2017-03-10 MED ORDER — METHOCARBAMOL 500 MG PO TABS: 1000.0000 mg | ORAL_TABLET | Freq: Three times a day (TID) | ORAL | 0 refills | Status: DC | PRN

## 2017-03-10 MED ORDER — METOCLOPRAMIDE HCL 10 MG PO TABS
10.0000 mg | ORAL_TABLET | Freq: Once | ORAL | Status: AC
  Administered 2017-03-10: 10 mg via ORAL
  Filled 2017-03-10: qty 1

## 2017-03-10 MED ORDER — METHYLPREDNISOLONE SODIUM SUCC 125 MG IJ SOLR
125.0000 mg | Freq: Once | INTRAMUSCULAR | Status: AC
  Administered 2017-03-10: 125 mg via INTRAMUSCULAR
  Filled 2017-03-10: qty 2

## 2017-03-10 NOTE — ED Provider Notes (Signed)
[facility] EMERGENCY DEPARTMENT Provider Note   CSN: [id number] Arrival date & time: 03/10/17  1708     History   Chief Complaint Chief Complaint  Patient presents with  . Headache    HPI Ann Hoover is a 36 y.o. female.  HPI She presents with 1 week of gradual onset headache.  Began as posterior scalp and neck pain which is gradually moved forward.  Has associated phonophobia and nausea.  No visual changes.  Denies abdominal pain.  No neck stiffness.  No fever or chills.  Denies nasal congestion or sinus pressure.  No focal weakness or numbness.  Patient states she has had a history of migraine headaches in the past.  Has been using naproxen and ibuprofen Past Medical History:  Diagnosis Date  . DVT (deep venous thrombosis) ([place])   . Herniated disc   . Pulmonary embolus ([facility])     There are no active problems to display for this patient.   Past Surgical History:  Procedure Laterality Date  . DILITATION & CURRETTAGE/HYSTROSCOPY WITH NOVASURE ABLATION N/A 06/07/2016   Procedure: DILATATION & CURETTAGE/HYSTEROSCOPY WITH NOVASURE ABLATION;  Surgeon: Florian Buff, MD;  Location: [facility];  Service: Gynecology;  Laterality: N/A;  . TUBAL LIGATION      OB History    Gravida Para Term Preterm AB Living   8 7 7  0 0 7   SAB TAB Ectopic Multiple Live Births   0 0 0 0         Home Medications    Prior to Admission medications   Medication Sig Start Date End Date Taking? Authorizing Provider  amphetamine-dextroamphetamine (ADDERALL) 20 MG tablet Take 10 mg by mouth 3 (three) times daily.    Yes [provider]  methadone (DOLOPHINE) 10 MG tablet Take 10 mg by mouth 2 (two) times daily. 05/15/14  Yes [provider]  naproxen (NAPROSYN) 500 MG tablet Take 1 tablet (500 mg total) by mouth 2 (two) times daily. 02/12/17  Yes Tanna Furry, MD  methocarbamol (ROBAXIN) 500 MG tablet Take 2 tablets (1,000 mg total) by mouth every 8 (eight) hours as needed for muscle  spasms. 03/10/17   Julianne Rice, MD  metoCLOPramide (REGLAN) 10 MG tablet Take 1 tablet (10 mg total) by mouth every 6 (six) hours as needed for nausea. 03/10/17   Julianne Rice, MD    Family History Family History  Problem Relation Age of Onset  . Hyperlipidemia Mother   . Diabetes Father   . Cancer Father        Hodkins  . COPD Father   . Cirrhosis Father   . Pancreatic cancer Maternal Grandmother   . Colon cancer Paternal Grandmother     Social History Social History  Substance Use Topics  . Smoking status: Current Every Day Smoker    Packs/day: 1.50    Years: 12.00    Types: Cigarettes  . Smokeless tobacco: Never Used  . Alcohol use Yes     Comment: occasional     Allergies   Patient has no known allergies.   Review of Systems Review of Systems  Constitutional: Negative for chills and fever.  HENT: Negative for congestion, facial swelling, rhinorrhea, sinus pain, sinus pressure, sore throat and trouble swallowing.   Eyes: Negative for photophobia and visual disturbance.  Respiratory: Negative for cough and shortness of breath.   Cardiovascular: Negative for chest pain.  Gastrointestinal: Positive for nausea. Negative for abdominal pain, diarrhea and vomiting.  Musculoskeletal: Positive for  myalgias and neck pain. Negative for back pain and neck stiffness.  Skin: Negative for rash and wound.  Neurological: Positive for headaches. Negative for dizziness, weakness, light-headedness and numbness.  All other systems reviewed and are negative.    Physical Exam Updated Vital Signs BP 121/69 (BP Location: Right Arm)   Pulse 67   Temp 98.6 F (37 C) (Oral)   Resp 18   Ht 5\' 8"  (1.727 m)   Wt 81.6 kg (180 lb)   SpO2 100%   BMI 27.37 kg/m   Physical Exam  Constitutional: She is oriented to person, place, and time. She appears well-developed and well-nourished. No distress.  HENT:  Head: Normocephalic and atraumatic.  Mouth/Throat: Oropharynx is clear  and moist. No oropharyngeal exudate.  Patient with tenderness to palpation over the occipital pole and temporal regions.  No obvious trauma.  Eyes: Pupils are equal, round, and reactive to light. EOM are normal.  Neck: Normal range of motion. Neck supple.  Bilateral paracervical tenderness to palpation.  No meningismus.  Cardiovascular: Normal rate and regular rhythm.  Exam reveals no gallop and no friction rub.   No murmur heard. Pulmonary/Chest: Effort normal and breath sounds normal.  Abdominal: Soft. Bowel sounds are normal. There is no tenderness. There is no rebound and no guarding.  Musculoskeletal: Normal range of motion. She exhibits no edema or tenderness.  Neurological: She is alert and oriented to person, place, and time.  Patient is alert and oriented x3 with clear, goal oriented speech. Patient has 5/5 motor in all extremities. Sensation is intact to light touch. Patient has a normal gait and walks without assistance.  Skin: Skin is warm and dry. No rash noted. No erythema.  Psychiatric: She has a normal mood and affect. Her behavior is normal.  Nursing note and vitals reviewed.    ED Treatments / Results  Labs (all labs ordered are listed, but only abnormal results are displayed) Labs Reviewed - No data to display  EKG  EKG Interpretation None       Radiology No results found.  Procedures Procedures (including critical care time)  Medications Ordered in ED Medications  ketorolac (TORADOL) injection 60 mg (60 mg Intramuscular Given 03/10/17 1821)  metoCLOPramide (REGLAN) tablet 10 mg (10 mg Oral Given 03/10/17 1821)  methylPREDNISolone sodium succinate (SOLU-MEDROL) 125 mg/2 mL injection 125 mg (125 mg Intramuscular Given 03/10/17 1821)  methocarbamol (ROBAXIN) tablet 1,000 mg (1,000 mg Oral Given 03/10/17 1821)     Initial Impression / Assessment and Plan / ED Course  I have reviewed the triage vital signs and the nursing notes.  Pertinent labs & imaging  results that were available during my care of the patient were reviewed by me and considered in my medical decision making (see chart for details).     Patient states headache is improved.  No reflux signs or symptoms.  Return precautions given.  Final Clinical Impressions(s) / ED Diagnoses   Final diagnoses:  Tension headache    New Prescriptions Discharge Medication List as of 03/10/2017  7:33 PM    START taking these medications   Details  methocarbamol (ROBAXIN) 500 MG tablet Take 2 tablets (1,000 mg total) by mouth every 8 (eight) hours as needed for muscle spasms., Starting Sat 03/10/2017, Print    metoCLOPramide (REGLAN) 10 MG tablet Take 1 tablet (10 mg total) by mouth every 6 (six) hours as needed for nausea., Starting Sat 03/10/2017, Print         Julianne Rice, MD  03/11/17 1624  

## 2017-03-10 NOTE — ED Triage Notes (Signed)
Pt reports neck pain with a headache for a week. She has not seen any provider in that time

## 2017-03-10 NOTE — ED Triage Notes (Signed)
Pt states that she has been having a bad headache and is hurting in her neck.  Pt states that she has been nauseous, but has not been vomiting.  Pt states that she has had this headache for the past week.  Pt denies fever, chills, diarrhea, and vomiting.

## 2017-12-08 ENCOUNTER — Emergency Department (HOSPITAL_COMMUNITY): Payer: Medicaid Other

## 2017-12-08 ENCOUNTER — Inpatient Hospital Stay (HOSPITAL_COMMUNITY)
Admission: EM | Admit: 2017-12-08 | Discharge: 2017-12-12 | DRG: 552 | Disposition: A | Discharge status: Home / Self Care | Payer: Medicaid Other | Attending: Orthopedic Surgery | Admitting: Orthopedic Surgery

## 2017-12-08 ENCOUNTER — Encounter (HOSPITAL_COMMUNITY): Payer: Self-pay | Admitting: Emergency Medicine

## 2017-12-08 ENCOUNTER — Other Ambulatory Visit: Payer: Self-pay

## 2017-12-08 DIAGNOSIS — F1290 Cannabis use, unspecified, uncomplicated: Secondary | ICD-10-CM | POA: Diagnosis present

## 2017-12-08 DIAGNOSIS — F17210 Nicotine dependence, cigarettes, uncomplicated: Secondary | ICD-10-CM | POA: Diagnosis present

## 2017-12-08 DIAGNOSIS — F1120 Opioid dependence, uncomplicated: Secondary | ICD-10-CM | POA: Diagnosis present

## 2017-12-08 DIAGNOSIS — Z79899 Other long term (current) drug therapy: Secondary | ICD-10-CM

## 2017-12-08 DIAGNOSIS — S322XXA Fracture of coccyx, initial encounter for closed fracture: Secondary | ICD-10-CM | POA: Diagnosis present

## 2017-12-08 DIAGNOSIS — D62 Acute posthemorrhagic anemia: Secondary | ICD-10-CM | POA: Diagnosis present

## 2017-12-08 DIAGNOSIS — N390 Urinary tract infection, site not specified: Secondary | ICD-10-CM | POA: Diagnosis present

## 2017-12-08 DIAGNOSIS — Z86718 Personal history of other venous thrombosis and embolism: Secondary | ICD-10-CM

## 2017-12-08 DIAGNOSIS — G8929 Other chronic pain: Secondary | ICD-10-CM | POA: Diagnosis present

## 2017-12-08 DIAGNOSIS — T148XXA Other injury of unspecified body region, initial encounter: Secondary | ICD-10-CM

## 2017-12-08 DIAGNOSIS — M549 Dorsalgia, unspecified: Secondary | ICD-10-CM | POA: Diagnosis present

## 2017-12-08 DIAGNOSIS — S3219XA Other fracture of sacrum, initial encounter for closed fracture: Principal | ICD-10-CM | POA: Diagnosis present

## 2017-12-08 DIAGNOSIS — Z86711 Personal history of pulmonary embolism: Secondary | ICD-10-CM

## 2017-12-08 DIAGNOSIS — S3210XA Unspecified fracture of sacrum, initial encounter for closed fracture: Secondary | ICD-10-CM

## 2017-12-08 DIAGNOSIS — F1190 Opioid use, unspecified, uncomplicated: Secondary | ICD-10-CM | POA: Diagnosis present

## 2017-12-08 HISTORY — DX: Dorsalgia, unspecified: M54.9

## 2017-12-08 HISTORY — DX: Other chronic pain: G89.29

## 2017-12-08 MED ORDER — BACITRACIN ZINC 500 UNIT/GM EX OINT
TOPICAL_OINTMENT | CUTANEOUS | Status: AC
  Filled 2017-12-08: qty 1.8

## 2017-12-08 MED ORDER — MORPHINE SULFATE (PF) 2 MG/ML IV SOLN
2.0000 mg | Freq: Once | INTRAVENOUS | Status: AC
  Administered 2017-12-08: 2 mg via INTRAVENOUS
  Filled 2017-12-08: qty 1

## 2017-12-08 MED ORDER — MORPHINE SULFATE (PF) 4 MG/ML IV SOLN
4.0000 mg | Freq: Once | INTRAVENOUS | Status: AC
  Administered 2017-12-08: 4 mg via INTRAVENOUS
  Filled 2017-12-08: qty 1

## 2017-12-08 MED ORDER — TETANUS-DIPHTH-ACELL PERTUSSIS 5-2.5-18.5 LF-MCG/0.5 IM SUSP
0.5000 mL | Freq: Once | INTRAMUSCULAR | Status: AC
  Administered 2017-12-08: 0.5 mL via INTRAMUSCULAR
  Filled 2017-12-08: qty 0.5

## 2017-12-08 MED ORDER — SODIUM CHLORIDE 0.9 % IV BOLUS
500.0000 mL | Freq: Once | INTRAVENOUS | Status: AC
  Administered 2017-12-08: 500 mL via INTRAVENOUS

## 2017-12-08 NOTE — ED Notes (Signed)
C-collar placed on patient.

## 2017-12-08 NOTE — ED Notes (Signed)
Unable to obtain oral temperature at this time 
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
## 2017-12-09 ENCOUNTER — Observation Stay (HOSPITAL_COMMUNITY): Payer: Medicaid Other

## 2017-12-09 DIAGNOSIS — N390 Urinary tract infection, site not specified: Secondary | ICD-10-CM | POA: Diagnosis not present

## 2017-12-09 DIAGNOSIS — S322XXA Fracture of coccyx, initial encounter for closed fracture: Secondary | ICD-10-CM | POA: Diagnosis not present

## 2017-12-09 DIAGNOSIS — M549 Dorsalgia, unspecified: Secondary | ICD-10-CM | POA: Diagnosis not present

## 2017-12-09 DIAGNOSIS — S3210XA Unspecified fracture of sacrum, initial encounter for closed fracture: Secondary | ICD-10-CM | POA: Diagnosis present

## 2017-12-09 DIAGNOSIS — Z86718 Personal history of other venous thrombosis and embolism: Secondary | ICD-10-CM | POA: Diagnosis not present

## 2017-12-09 DIAGNOSIS — F1290 Cannabis use, unspecified, uncomplicated: Secondary | ICD-10-CM | POA: Diagnosis not present

## 2017-12-09 DIAGNOSIS — D62 Acute posthemorrhagic anemia: Secondary | ICD-10-CM | POA: Diagnosis not present

## 2017-12-09 DIAGNOSIS — Z86711 Personal history of pulmonary embolism: Secondary | ICD-10-CM | POA: Diagnosis not present

## 2017-12-09 DIAGNOSIS — S3219XA Other fracture of sacrum, initial encounter for closed fracture: Secondary | ICD-10-CM | POA: Diagnosis present

## 2017-12-09 DIAGNOSIS — F1190 Opioid use, unspecified, uncomplicated: Secondary | ICD-10-CM | POA: Diagnosis not present

## 2017-12-09 DIAGNOSIS — F17210 Nicotine dependence, cigarettes, uncomplicated: Secondary | ICD-10-CM | POA: Diagnosis not present

## 2017-12-09 DIAGNOSIS — G8929 Other chronic pain: Secondary | ICD-10-CM | POA: Diagnosis not present

## 2017-12-09 DIAGNOSIS — Z79899 Other long term (current) drug therapy: Secondary | ICD-10-CM | POA: Diagnosis not present

## 2017-12-09 MED ORDER — ONDANSETRON HCL 4 MG/2ML IJ SOLN
4.0000 mg | Freq: Once | INTRAMUSCULAR | Status: AC
  Administered 2017-12-09: 4 mg via INTRAVENOUS
  Filled 2017-12-09: qty 2

## 2017-12-09 MED ORDER — MORPHINE SULFATE (PF) 2 MG/ML IV SOLN: 1.0000 mg | INTRAVENOUS | Status: DC | PRN

## 2017-12-09 MED ORDER — ACETAMINOPHEN 650 MG RE SUPP: 650.0000 mg | Freq: Four times a day (QID) | RECTAL | Status: DC | PRN

## 2017-12-09 MED ORDER — HYDROMORPHONE HCL 1 MG/ML IJ SOLN
1.0000 mg | Freq: Once | INTRAMUSCULAR | Status: AC
  Administered 2017-12-09: 1 mg via INTRAVENOUS
  Filled 2017-12-09: qty 1

## 2017-12-09 MED ORDER — METHOCARBAMOL 500 MG PO TABS
500.0000 mg | ORAL_TABLET | Freq: Four times a day (QID) | ORAL | Status: DC | PRN
  Administered 2017-12-09 (×2): 500 mg via ORAL
  Filled 2017-12-09 (×2): qty 1

## 2017-12-09 MED ORDER — HYDROCODONE-ACETAMINOPHEN 7.5-325 MG PO TABS
1.0000 | ORAL_TABLET | Freq: Four times a day (QID) | ORAL | Status: DC | PRN
  Administered 2017-12-09 – 2017-12-10 (×4): 2 via ORAL
  Filled 2017-12-09 (×4): qty 2

## 2017-12-09 MED ORDER — MORPHINE SULFATE (PF) 4 MG/ML IV SOLN
4.0000 mg | Freq: Once | INTRAVENOUS | Status: AC
  Administered 2017-12-09: 4 mg via INTRAVENOUS
  Filled 2017-12-09: qty 1

## 2017-12-09 MED ORDER — DOCUSATE SODIUM 100 MG PO CAPS
100.0000 mg | ORAL_CAPSULE | Freq: Two times a day (BID) | ORAL | Status: DC
  Administered 2017-12-09 – 2017-12-12 (×4): 100 mg via ORAL
  Filled 2017-12-09 (×7): qty 1

## 2017-12-09 MED ORDER — METHOCARBAMOL 1000 MG/10ML IJ SOLN
500.0000 mg | Freq: Four times a day (QID) | INTRAVENOUS | Status: DC | PRN
  Filled 2017-12-09: qty 5

## 2017-12-09 MED ORDER — ACETAMINOPHEN 325 MG PO TABS: 650.0000 mg | ORAL_TABLET | Freq: Four times a day (QID) | ORAL | Status: DC | PRN
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
## 2017-12-09 NOTE — Progress Notes (Signed)
Orthopedic Tech Progress Note Patient Details:  Ann Hoover 07/30/1980 [id number]  Ortho Devices Ortho Device/Splint Location: Trapeze bar Ortho Device/Splint Interventions: Application   Post Interventions Patient Tolerated: Well Instructions Provided: Care of device   [place] Pink 12/09/2017, 6:54 PM
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
## 2017-12-09 NOTE — Progress Notes (Signed)
RN notified attending doctor's office that patient has arrived from [facility] to [facility] via [place].
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
## 2017-12-10 DIAGNOSIS — Z86711 Personal history of pulmonary embolism: Secondary | ICD-10-CM | POA: Diagnosis not present

## 2017-12-10 DIAGNOSIS — F1290 Cannabis use, unspecified, uncomplicated: Secondary | ICD-10-CM | POA: Diagnosis present

## 2017-12-10 DIAGNOSIS — S322XXA Fracture of coccyx, initial encounter for closed fracture: Secondary | ICD-10-CM | POA: Diagnosis present

## 2017-12-10 DIAGNOSIS — N390 Urinary tract infection, site not specified: Secondary | ICD-10-CM | POA: Diagnosis present

## 2017-12-10 DIAGNOSIS — G8929 Other chronic pain: Secondary | ICD-10-CM | POA: Diagnosis present

## 2017-12-10 DIAGNOSIS — D62 Acute posthemorrhagic anemia: Secondary | ICD-10-CM | POA: Diagnosis present

## 2017-12-10 DIAGNOSIS — S3219XA Other fracture of sacrum, initial encounter for closed fracture: Secondary | ICD-10-CM | POA: Diagnosis not present

## 2017-12-10 DIAGNOSIS — Z79899 Other long term (current) drug therapy: Secondary | ICD-10-CM | POA: Diagnosis not present

## 2017-12-10 DIAGNOSIS — F17210 Nicotine dependence, cigarettes, uncomplicated: Secondary | ICD-10-CM | POA: Diagnosis present

## 2017-12-10 DIAGNOSIS — Z86718 Personal history of other venous thrombosis and embolism: Secondary | ICD-10-CM | POA: Diagnosis not present

## 2017-12-10 DIAGNOSIS — M549 Dorsalgia, unspecified: Secondary | ICD-10-CM | POA: Diagnosis present

## 2017-12-10 DIAGNOSIS — F1190 Opioid use, unspecified, uncomplicated: Secondary | ICD-10-CM | POA: Diagnosis present

## 2017-12-10 LAB — URINALYSIS, ROUTINE W REFLEX MICROSCOPIC
GLUCOSE, UA: NEGATIVE mg/dL
KETONES UR: NEGATIVE mg/dL
NITRITE: POSITIVE — AB
PROTEIN: NEGATIVE mg/dL
Specific Gravity, Urine: 1.034 — ABNORMAL HIGH (ref 1.005–1.030)
pH: 5 (ref 5.0–8.0)

## 2017-12-10 LAB — RAPID URINE DRUG SCREEN, HOSP PERFORMED
AMPHETAMINES: POSITIVE — AB
Barbiturates: NOT DETECTED
Benzodiazepines: NOT DETECTED
Cocaine: NOT DETECTED
Opiates: POSITIVE — AB
Tetrahydrocannabinol: POSITIVE — AB

## 2017-12-10 MED ORDER — METHOCARBAMOL 750 MG PO TABS
750.0000 mg | ORAL_TABLET | Freq: Three times a day (TID) | ORAL | Status: DC
  Administered 2017-12-10 – 2017-12-12 (×7): 750 mg via ORAL
  Filled 2017-12-10 (×7): qty 1

## 2017-12-10 MED ORDER — ACETAMINOPHEN 325 MG PO TABS
650.0000 mg | ORAL_TABLET | Freq: Four times a day (QID) | ORAL | Status: DC
  Administered 2017-12-10 – 2017-12-12 (×9): 650 mg via ORAL
  Filled 2017-12-10 (×8): qty 2

## 2017-12-10 MED ORDER — KETOROLAC TROMETHAMINE 15 MG/ML IJ SOLN
15.0000 mg | Freq: Three times a day (TID) | INTRAMUSCULAR | Status: DC
  Administered 2017-12-10 – 2017-12-12 (×6): 15 mg via INTRAVENOUS
  Filled 2017-12-10 (×6): qty 1

## 2017-12-10 MED ORDER — ACETAMINOPHEN 650 MG RE SUPP: 650.0000 mg | Freq: Four times a day (QID) | RECTAL | Status: DC

## 2017-12-10 MED ORDER — HYDROMORPHONE HCL 2 MG PO TABS
2.0000 mg | ORAL_TABLET | Freq: Once | ORAL | Status: AC
  Administered 2017-12-10: 2 mg via ORAL
  Filled 2017-12-10: qty 1

## 2017-12-10 MED ORDER — METHOCARBAMOL 1000 MG/10ML IJ SOLN: 500.0000 mg | Freq: Four times a day (QID) | INTRAVENOUS | Status: DC | PRN

## 2017-12-10 MED ORDER — METHOCARBAMOL 1000 MG/10ML IJ SOLN
500.0000 mg | Freq: Three times a day (TID) | INTRAVENOUS | Status: DC
  Filled 2017-12-10 (×8): qty 5

## 2017-12-10 MED ORDER — HYDROMORPHONE HCL 2 MG PO TABS
2.0000 mg | ORAL_TABLET | ORAL | Status: DC | PRN
  Administered 2017-12-10: 4 mg via ORAL
  Administered 2017-12-11: 2 mg via ORAL
  Administered 2017-12-11 – 2017-12-12 (×2): 4 mg via ORAL
  Filled 2017-12-10: qty 2
  Filled 2017-12-10: qty 1
  Filled 2017-12-10 (×2): qty 2

## 2017-12-10 MED ORDER — METHOCARBAMOL 500 MG PO TABS: 500.0000 mg | ORAL_TABLET | Freq: Four times a day (QID) | ORAL | Status: DC | PRN
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
## 2017-12-10 NOTE — Plan of Care (Signed)
?  Problem: Coping: ?Goal: Level of anxiety will decrease ?Outcome: Progressing ?  ?Problem: Safety: ?Goal: Ability to remain free from injury will improve ?Outcome: Progressing ?  ?
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
## 2017-12-11 DIAGNOSIS — N390 Urinary tract infection, site not specified: Secondary | ICD-10-CM | POA: Diagnosis present

## 2017-12-11 MED ORDER — NITROFURANTOIN MONOHYD MACRO 100 MG PO CAPS
100.0000 mg | ORAL_CAPSULE | Freq: Two times a day (BID) | ORAL | Status: DC
  Administered 2017-12-11 – 2017-12-12 (×2): 100 mg via ORAL
  Filled 2017-12-11 (×2): qty 1
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
## 2017-12-11 NOTE — Plan of Care (Signed)
?  Problem: Coping: ?Goal: Level of anxiety will decrease ?Outcome: Progressing ?  ?Problem: Safety: ?Goal: Ability to remain free from injury will improve ?Outcome: Progressing ?  ?
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
## 2017-12-12 ENCOUNTER — Encounter (HOSPITAL_COMMUNITY): Payer: Self-pay | Admitting: Orthopedic Surgery

## 2017-12-12 DIAGNOSIS — M549 Dorsalgia, unspecified: Secondary | ICD-10-CM

## 2017-12-12 DIAGNOSIS — G8929 Other chronic pain: Secondary | ICD-10-CM | POA: Diagnosis present

## 2017-12-12 DIAGNOSIS — F1120 Opioid dependence, uncomplicated: Secondary | ICD-10-CM | POA: Diagnosis present

## 2017-12-12 MED ORDER — KETOROLAC TROMETHAMINE 10 MG PO TABS: 10.0000 mg | ORAL_TABLET | Freq: Four times a day (QID) | ORAL | 0 refills | Status: DC | PRN

## 2017-12-12 MED ORDER — DOCUSATE SODIUM 100 MG PO CAPS: 100.0000 mg | ORAL_CAPSULE | Freq: Two times a day (BID) | ORAL | 0 refills | Status: DC

## 2017-12-12 MED ORDER — NITROFURANTOIN MONOHYD MACRO 100 MG PO CAPS: 100.0000 mg | ORAL_CAPSULE | Freq: Two times a day (BID) | ORAL | 0 refills | Status: AC

## 2017-12-12 MED ORDER — ENOXAPARIN SODIUM 40 MG/0.4ML ~~LOC~~ SOLN
40.0000 mg | SUBCUTANEOUS | Status: DC
  Administered 2017-12-12: 40 mg via SUBCUTANEOUS
  Filled 2017-12-12: qty 0.4

## 2017-12-12 MED ORDER — ENOXAPARIN (LOVENOX) PATIENT EDUCATION KIT
PACK | Freq: Once | Status: AC
  Administered 2017-12-12: 10:00:00
  Filled 2017-12-12: qty 1

## 2017-12-12 MED ORDER — HYDROMORPHONE HCL 2 MG PO TABS: 2.0000 mg | ORAL_TABLET | Freq: Three times a day (TID) | ORAL | 0 refills | Status: DC | PRN

## 2017-12-12 MED ORDER — ACETAMINOPHEN 325 MG PO TABS: 650.0000 mg | ORAL_TABLET | Freq: Four times a day (QID) | ORAL | 0 refills | Status: DC

## 2017-12-12 MED ORDER — ENOXAPARIN SODIUM 40 MG/0.4ML ~~LOC~~ SOLN: 40.0000 mg | SUBCUTANEOUS | 0 refills | Status: DC

## 2017-12-12 MED ORDER — ENOXAPARIN (LOVENOX) PATIENT EDUCATION KIT: 1.0000 | PACK | Freq: Once | 0 refills | Status: AC

## 2017-12-12 MED ORDER — METHOCARBAMOL 750 MG PO TABS: 750.0000 mg | ORAL_TABLET | Freq: Three times a day (TID) | ORAL | 0 refills | Status: DC | PRN
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
## 2017-12-12 NOTE — Progress Notes (Signed)
Discharge instructions (including medications) discussed with and copy provided to patient/caregiver. All belongings and DME sent home with patient.

## 2017-12-12 NOTE — Discharge Instructions (Addendum)
Orthopaedic Trauma Service Discharge Instructions   General Discharge Instructions  WEIGHT BEARING STATUS: Weightbearing as tolerated both legs with walker  RANGE OF MOTION/ACTIVITY: activity as tolerated. No restrictions. Activity allowed as your pain allows   DVT/PE prophylaxis: Lovenox 40 mg subcutaneous injection daily x 28 days   Diet: as you were eating previously.  Can use over the counter stool softeners and bowel preparations, such as Miralax, to help with bowel movements.  Narcotics can be constipating.  Be sure to drink plenty of fluids  PAIN MEDICATION USE AND EXPECTATIONS  You have likely been given narcotic medications to help control your pain.  After a traumatic event that results in an fracture (broken bone) with or without surgery, it is ok to use narcotic pain medications to help control one's pain.  We understand that everyone responds to pain differently and each individual patient will be evaluated on a regular basis for the continued need for narcotic medications. Ideally, narcotic medication use should last no more than 6-8 weeks (coinciding with fracture healing).   As a patient it is your responsibility as well to monitor narcotic medication use and report the amount and frequency you use these medications when you come to your office visit.   We would also advise that if you are using narcotic medications, you should take a dose prior to therapy to maximize you participation.  IF YOU ARE ON NARCOTIC MEDICATIONS IT IS NOT PERMISSIBLE TO OPERATE A MOTOR VEHICLE (MOTORCYCLE/CAR/TRUCK/MOPED) OR HEAVY MACHINERY DO NOT MIX NARCOTICS WITH OTHER CNS (CENTRAL NERVOUS SYSTEM) DEPRESSANTS SUCH AS ALCOHOL   STOP SMOKING OR USING NICOTINE PRODUCTS!!!!  As discussed nicotine severely impairs your body's ability to heal surgical and traumatic wounds but also impairs bone healing.  Wounds and bone heal by forming microscopic blood vessels (angiogenesis) and nicotine is a  vasoconstrictor (essentially, shrinks blood vessels).  Therefore, if vasoconstriction occurs to these microscopic blood vessels they essentially disappear and are unable to deliver necessary nutrients to the healing tissue.  This is one modifiable factor that you can do to dramatically increase your chances of healing your injury.    (This means no smoking, no nicotine gum, patches, etc)     ICE AND ELEVATE INJURED/OPERATIVE EXTREMITY  Using ice and elevating the injured extremity above your heart can help with swelling and pain control.  Icing in a pulsatile fashion, such as 20 minutes on and 20 minutes off, can be followed.    Do not place ice directly on skin. Make sure there is a barrier between to skin and the ice pack.    Using frozen items such as frozen peas works well as the conform nicely to the are that needs to be iced.  USE AN ACE WRAP OR TED HOSE FOR SWELLING CONTROL  In addition to icing and elevation, Ace wraps or TED hose are used to help limit and resolve swelling.  It is recommended to use Ace wraps or TED hose until you are informed to stop.    When using Ace Wraps start the wrapping distally (farthest away from the body) and wrap proximally (closer to the body)   Example: If you had surgery on your leg or thing and you do not have a splint on, start the ace wrap at the toes and work your way up to the thigh        If you had surgery on your upper extremity and do not have a splint on, start the ace wrap at  your fingers and work your way up to the upper arm  IF YOU ARE IN A SPLINT OR CAST DO NOT REMOVE IT FOR ANY REASON   If your splint gets wet for any reason please contact the office immediately. You may shower in your splint or cast as long as you keep it dry.  This can be done by wrapping in a cast cover or garbage back (or similar)  Do Not stick any thing down your splint or cast such as pencils, money, or hangers to try and scratch yourself with.  If you feel itchy take  benadryl as prescribed on the bottle for itching  IF YOU ARE IN A CAM BOOT (BLACK BOOT)  You may remove boot periodically. Perform daily dressing changes as noted below.  Wash the liner of the boot regularly and wear a sock when wearing the boot. It is recommended that you sleep in the boot until told otherwise  CALL THE OFFICE WITH ANY QUESTIONS OR CONCERNS: [phone]
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
## 2017-12-12 NOTE — Progress Notes (Signed)
PT Cancellation Note  Patient Details Name: Ann Hoover MRN: [id number] DOB: July 23, 1980   Cancelled Treatment:    Reason Eval/Treat Not Completed: (P) Patient declined, no reason specified(Pt reports she feels confident in negotiating stairs when d/c home.  Pt denies further needs for PT at this time.  All questions answered.  )   Cristela Blue 12/12/2017, 12:56 PM  Governor Rooks, PTA pager [phone]

## 2017-12-12 NOTE — Progress Notes (Signed)
OT Cancellation Note  Patient Details Name: Ann Hoover MRN: [id number] DOB: July 21, 1980   Cancelled Treatment:    Reason Eval/Treat Not Completed: Other (comment)(Pt states she has no further questions for OT)   Pt plans to DC home this day Kari Baars, [place]  Payton Mccallum D 12/12/2017, 10:15 AM
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
## 2017-12-13 LAB — URINE CULTURE: Culture: >=100000 — AB
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
## 2019-08-07 NOTE — Progress Notes (Signed)
NO-SHOW for appointment

## 2019-08-08 ENCOUNTER — Ambulatory Visit (INDEPENDENT_AMBULATORY_CARE_PROVIDER_SITE_OTHER): Payer: Medicaid Other | Admitting: Adult Health

## 2019-08-08 DIAGNOSIS — Z5329 Procedure and treatment not carried out because of patient's decision for other reasons: Secondary | ICD-10-CM

## 2021-05-16 ENCOUNTER — Emergency Department (HOSPITAL_COMMUNITY)
Admission: EM | Admit: 2021-05-16 | Discharge: 2021-05-16 | Disposition: A | Discharge status: Home / Self Care | Payer: Medicaid Other | Attending: Emergency Medicine | Admitting: Emergency Medicine

## 2021-05-16 ENCOUNTER — Encounter (HOSPITAL_COMMUNITY): Payer: Self-pay

## 2021-05-16 ENCOUNTER — Other Ambulatory Visit: Payer: Self-pay

## 2021-05-16 DIAGNOSIS — R1010 Upper abdominal pain, unspecified: Secondary | ICD-10-CM

## 2021-05-16 DIAGNOSIS — Z7901 Long term (current) use of anticoagulants: Secondary | ICD-10-CM | POA: Diagnosis not present

## 2021-05-16 DIAGNOSIS — R1013 Epigastric pain: Secondary | ICD-10-CM | POA: Diagnosis not present

## 2021-05-16 DIAGNOSIS — R1011 Right upper quadrant pain: Secondary | ICD-10-CM | POA: Diagnosis not present

## 2021-05-16 LAB — COMPREHENSIVE METABOLIC PANEL
ALT: 191 U/L — ABNORMAL HIGH (ref 0–44)
AST: 28 U/L (ref 15–41)
Albumin: 3.7 g/dL (ref 3.5–5.0)
Alkaline Phosphatase: 66 U/L (ref 38–126)
Anion gap: 8 (ref 5–15)
BUN: 10 mg/dL (ref 6–20)
CO2: 28 mmol/L (ref 22–32)
Calcium: 8.6 mg/dL — ABNORMAL LOW (ref 8.9–10.3)
Chloride: 102 mmol/L (ref 98–111)
Creatinine, Ser: 0.78 mg/dL (ref 0.44–1.00)
GFR, Estimated: >60 mL/min (ref >60)
Glucose, Bld: 78 mg/dL (ref 70–99)
Potassium: 3.7 mmol/L (ref 3.5–5.1)
Sodium: 138 mmol/L (ref 135–145)
Total Bilirubin: 0.4 mg/dL (ref 0.3–1.2)
Total Protein: 6.6 g/dL (ref 6.5–8.1)

## 2021-05-16 LAB — CBC
HCT: 42.9 % (ref 36.0–46.0)
Hemoglobin: 14.3 g/dL (ref 12.0–15.0)
MCH: 30 pg (ref 26.0–34.0)
MCHC: 33.3 g/dL (ref 30.0–36.0)
MCV: 90.1 fL (ref 80.0–100.0)
Platelets: 183 10*3/uL (ref 150–400)
RBC: 4.76 MIL/uL (ref 3.87–5.11)
RDW: 12.5 % (ref 11.5–15.5)
WBC: 6.9 10*3/uL (ref 4.0–10.5)
nRBC: 0 % (ref 0.0–0.2)

## 2021-05-16 LAB — LIPASE, BLOOD: Lipase: 33 U/L (ref 11–51)

## 2021-05-16 LAB — URINALYSIS, ROUTINE W REFLEX MICROSCOPIC
Bilirubin Urine: NEGATIVE
Glucose, UA: NEGATIVE mg/dL
Hgb urine dipstick: NEGATIVE
Ketones, ur: NEGATIVE mg/dL
Leukocytes,Ua: NEGATIVE
Nitrite: NEGATIVE
Protein, ur: NEGATIVE mg/dL
Specific Gravity, Urine: 1.02 (ref 1.005–1.030)
pH: 7 (ref 5.0–8.0)

## 2021-05-16 LAB — PREGNANCY, URINE: Preg Test, Ur: NEGATIVE

## 2021-05-16 MED ORDER — PANTOPRAZOLE SODIUM 20 MG PO TBEC: 20.0000 mg | DELAYED_RELEASE_TABLET | Freq: Every day | ORAL | 0 refills | Status: AC

## 2021-05-16 MED ORDER — ONDANSETRON 4 MG PO TBDP
4.0000 mg | ORAL_TABLET | Freq: Once | ORAL | Status: AC
  Administered 2021-05-16: 4 mg via ORAL
  Filled 2021-05-16: qty 1

## 2021-05-16 MED ORDER — LIDOCAINE VISCOUS HCL 2 % MT SOLN
15.0000 mL | Freq: Once | OROMUCOSAL | Status: AC
  Administered 2021-05-16: 15 mL via ORAL
  Filled 2021-05-16: qty 15

## 2021-05-16 MED ORDER — ONDANSETRON 4 MG PO TBDP: 4.0000 mg | ORAL_TABLET | Freq: Three times a day (TID) | ORAL | 0 refills | Status: DC | PRN

## 2021-05-16 MED ORDER — MAALOX MULTI SYMPTOM MAX ST 400-400-40 MG/5ML PO SUSP: 15.0000 mL | Freq: Four times a day (QID) | ORAL | 0 refills | Status: DC | PRN

## 2021-05-16 MED ORDER — ALUM & MAG HYDROXIDE-SIMETH 200-200-20 MG/5ML PO SUSP
30.0000 mL | Freq: Once | ORAL | Status: AC
  Administered 2021-05-16: 30 mL via ORAL
  Filled 2021-05-16: qty 30

## 2021-05-16 MED ORDER — OXYCODONE-ACETAMINOPHEN 5-325 MG PO TABS
1.0000 | ORAL_TABLET | Freq: Once | ORAL | Status: AC
  Administered 2021-05-16: 1 via ORAL
  Filled 2021-05-16: qty 1

## 2021-05-16 MED ORDER — HYDROCODONE-ACETAMINOPHEN 5-325 MG PO TABS: 1.0000 | ORAL_TABLET | Freq: Four times a day (QID) | ORAL | 0 refills | Status: DC | PRN

## 2021-05-16 NOTE — Discharge Instructions (Addendum)
Use Zofran to help with nausea and vomiting. Take Protonix daily for the next 2 weeks to help decrease stomach acid. Use Tylenol or ibuprofen as needed for mild to moderate pain.  Use Norco as needed for severe breakthrough pain.  Have caution, this make you tired already.  Do not drive or operate machinery while taking this medicine. Use Maalox as needed for breakthrough abdominal pain or burning. Avoid spicy, greasy, acidic foods.  There is information about foods that may irritate your gallbladder in the paperwork. Follow the instructions below to get an ultrasound tomorrow to evaluate the gallbladder. Call the general surgery office listed below to set up a follow-up appointment. Return to the emergency room if you develop high fevers, persistent vomiting, severe worsening pain, any new, worsening, or concerning symptoms

## 2021-05-16 NOTE — ED Provider Notes (Signed)
[facility] EMERGENCY DEPARTMENT Provider Note   CSN: [id number] Arrival date & time: 05/16/21  1021     History  Chief Complaint  Patient presents with   Abdominal Pain    Ann Hoover is a 41 y.o. female presenting for evaluation of nausea, vomiting, abdominal pain.  Patient states for several years she has had intermittent epigastric and right upper quadrant domino pain.  Over the past week, pain has been constant and severe.  She is associated nausea and vomiting.  No fevers or chills.  No urinary symptoms or abnormal bowel movements.  She still has her gallbladder.  Pain is worse after eating, and especially worse after drinking alcohol which she does not do regularly.  She has not taken anything for her symptoms.  Pain radiates to the R side of her back.   HPI     Home Medications Prior to Admission medications   Medication Sig Start Date End Date Taking? Authorizing Provider  alum & mag hydroxide-simeth (MAALOX MULTI SYMPTOM MAX ST) 400-400-40 MG/5ML suspension Take 15 mLs by mouth every 6 (six) hours as needed for indigestion. 05/16/21  Yes Peightyn Roberson, PA-C  HYDROcodone-acetaminophen (NORCO/VICODIN) 5-325 MG tablet Take 1 tablet by mouth every 6 (six) hours as needed. 05/16/21  Yes Cortney Beissel, PA-C  ondansetron (ZOFRAN-ODT) 4 MG disintegrating tablet Take 1 tablet (4 mg total) by mouth every 8 (eight) hours as needed for nausea or vomiting. 05/16/21  Yes Gwendalynn Eckstrom, PA-C  pantoprazole (PROTONIX) 20 MG tablet Take 1 tablet (20 mg total) by mouth daily. 05/16/21  Yes Carmen Tolliver, PA-C  acetaminophen (TYLENOL) 325 MG tablet Take 2 tablets (650 mg total) by mouth every 6 (six) hours. 12/12/17   Ainsley Spinner, PA-C  amphetamine-dextroamphetamine (ADDERALL) 20 MG tablet Take 10 mg by mouth 3 (three) times daily.     [provider]  docusate sodium (COLACE) 100 MG capsule Take 1 capsule (100 mg total) by mouth 2 (two) times daily. 12/12/17   Ainsley Spinner, PA-C   enoxaparin (LOVENOX) 40 MG/0.4ML injection Inject 0.4 mLs (40 mg total) into the skin daily. 12/12/17   Ainsley Spinner, PA-C  HYDROmorphone (DILAUDID) 2 MG tablet Take 1-2 tablets (2-4 mg total) by mouth every 8 (eight) hours as needed for moderate pain or severe pain. 12/12/17   Ainsley Spinner, PA-C  ketorolac (TORADOL) 10 MG tablet Take 1 tablet (10 mg total) by mouth every 6 (six) hours as needed for moderate pain. 12/12/17   Ainsley Spinner, PA-C  methocarbamol (ROBAXIN) 750 MG tablet Take 1 tablet (750 mg total) by mouth every 8 (eight) hours as needed for muscle spasms. 12/12/17   Ainsley Spinner, PA-C      Allergies    Patient has no known allergies.    Review of Systems   Review of Systems  Gastrointestinal:  Positive for abdominal pain, nausea and vomiting.  All other systems reviewed and are negative.  Physical Exam Updated Vital Signs BP 121/76 (BP Location: Right Arm)    Pulse (!) 52    Temp 98.1 F (36.7 C) (Oral)    Resp 16    Ht 5\' 8"  (1.727 m)    Wt 68 kg    SpO2 99%    BMI 22.81 kg/m  Physical Exam Vitals and nursing note reviewed.  Constitutional:      General: She is not in acute distress.    Appearance: Normal appearance.     Comments: nontoxic  HENT:     Head: Normocephalic  and atraumatic.  Eyes:     Conjunctiva/sclera: Conjunctivae normal.     Pupils: Pupils are equal, round, and reactive to light.  Cardiovascular:     Rate and Rhythm: Normal rate and regular rhythm.     Pulses: Normal pulses.  Pulmonary:     Effort: Pulmonary effort is normal. No respiratory distress.     Breath sounds: Normal breath sounds. No wheezing.     Comments: Speaking in full sentences.  Clear lung sounds in all fields. Abdominal:     General: There is no distension.     Palpations: Abdomen is soft. There is no mass.     Tenderness: There is abdominal tenderness in the right upper quadrant and epigastric area. There is no guarding or rebound.     Comments: Tenderness palpation of epigastric  and right upper quadrant abdomen.  Positive Murphy's.  No rigidity, guarding, distention.  Negative rebound.  No CVA tenderness  Musculoskeletal:        General: Normal range of motion.     Cervical back: Normal range of motion and neck supple.  Skin:    General: Skin is warm and dry.     Capillary Refill: Capillary refill takes less than 2 seconds.  Neurological:     Mental Status: She is alert and oriented to person, place, and time.  Psychiatric:        Mood and Affect: Mood and affect normal.        Speech: Speech normal.        Behavior: Behavior normal.    ED Results / Procedures / Treatments   Labs (all labs ordered are listed, but only abnormal results are displayed) Labs Reviewed  COMPREHENSIVE METABOLIC PANEL - Abnormal; Notable for the following components:      Result Value   Calcium 8.6 (*)    ALT 191 (*)    All other components within normal limits  LIPASE, BLOOD  CBC  URINALYSIS, ROUTINE W REFLEX MICROSCOPIC  PREGNANCY, URINE    EKG None  Radiology No results found.  Procedures Procedures    Medications Ordered in ED Medications  ondansetron (ZOFRAN-ODT) disintegrating tablet 4 mg (4 mg Oral Given 05/16/21 1402)  oxyCODONE-acetaminophen (PERCOCET/ROXICET) 5-325 MG per tablet 1 tablet (1 tablet Oral Given 05/16/21 1402)  alum & mag hydroxide-simeth (MAALOX/MYLANTA) 200-200-20 MG/5ML suspension 30 mL (30 mLs Oral Given 05/16/21 1449)    And  lidocaine (XYLOCAINE) 2 % viscous mouth solution 15 mL (15 mLs Oral Given 05/16/21 1449)    ED Course/ Medical Decision Making/ A&P                           Medical Decision Making   This patient presents to the ED for concern of n/v, abd pain. This involves an extensive number of treatment options, and is a complaint that carries with it a moderate risk of complications and morbidity.  The differential diagnosis includes gastritis, PUD, gallstones, cholecystitis, kidney stone    Lab Tests:  I ordered, and  personally interpreted labs.  The pertinent results include:  normal bili. LFTs mostly normal outside mildly elevated alt. No leukocytosis. UA clear without blood. Lipase normal. As such, doubt acute gallbladder infection or obstructing stone. Doubt UTI or kidney stone.    Imaging Studies:  Unfortunately [place] is not available at this time. Pt will need OP RUQ [place].     Medicines ordered:  I ordered medication including nausea and pain control  Reevaluation of the patient after these medicines showed that the patient improved.  Tolerating p.o. without vomiting.   Dispostion:  After consideration of the diagnostic results and the patients response to treatment, I feel that the patent would benefit from symptomatic management in the outpatient setting with outpatient ultrasound and general surgery follow-up.  Most likely gallbladder, also consider gastritis/PUD which can be treated symptomatically.  At this time, patient appear safe for discharge.  Return precautions given.  Patient states she understands and agrees to plan  Final Clinical Impression(s) / ED Diagnoses Final diagnoses:  Upper abdominal pain    Rx / DC Orders ED Discharge Orders          Ordered    US Abdomen Limited RUQ/Gall Gladder        05/16/21 1557    ondansetron (ZOFRAN-ODT) 4 MG disintegrating tablet  Every 8 hours PRN        05/16/21 1557    alum & mag hydroxide-simeth (MAALOX MULTI SYMPTOM MAX ST) 400-400-40 MG/5ML suspension  Every 6 hours PRN        05/16/21 1557    HYDROcodone-acetaminophen (NORCO/VICODIN) 5-325 MG tablet  Every 6 hours PRN        05/16/21 1557    pantoprazole (PROTONIX) 20 MG tablet  Daily        05/16/21 [place], Tory Septer, PA-C 66/59/93 [id number]    Lianne Cure, DO 77/93/90 1638

## 2021-05-16 NOTE — ED Notes (Addendum)
Pt with mid upper abd pain that radiates around to mid back on right.  At times worse with after eating.  No emesis today.

## 2021-05-16 NOTE — ED Triage Notes (Signed)
Patient reports RUQ pain for the past week. States that she has had it intermittently for the past several months but it is more regular now. Intermittent N/V.

## 2021-05-17 ENCOUNTER — Encounter (HOSPITAL_COMMUNITY): Payer: Self-pay | Admitting: *Deleted

## 2021-05-17 ENCOUNTER — Inpatient Hospital Stay (HOSPITAL_COMMUNITY)
Admission: EM | Admit: 2021-05-17 | Discharge: 2021-05-19 | DRG: 419 | Disposition: A | Discharge status: Home / Self Care | Payer: Medicaid Other | Attending: General Surgery | Admitting: General Surgery

## 2021-05-17 ENCOUNTER — Ambulatory Visit (HOSPITAL_COMMUNITY)
Admission: RE | Admit: 2021-05-17 | Discharge: 2021-05-17 | Disposition: A | Discharge status: Home / Self Care | Payer: Medicaid Other | Source: Ambulatory Visit | Attending: Student | Admitting: Student

## 2021-05-17 ENCOUNTER — Other Ambulatory Visit: Payer: Self-pay

## 2021-05-17 DIAGNOSIS — Z79899 Other long term (current) drug therapy: Secondary | ICD-10-CM

## 2021-05-17 DIAGNOSIS — Z800 Family history of malignant neoplasm of digestive organs: Secondary | ICD-10-CM

## 2021-05-17 DIAGNOSIS — Z833 Family history of diabetes mellitus: Secondary | ICD-10-CM

## 2021-05-17 DIAGNOSIS — Z825 Family history of asthma and other chronic lower respiratory diseases: Secondary | ICD-10-CM

## 2021-05-17 DIAGNOSIS — J90 Pleural effusion, not elsewhere classified: Secondary | ICD-10-CM | POA: Insufficient documentation

## 2021-05-17 DIAGNOSIS — K8020 Calculus of gallbladder without cholecystitis without obstruction: Secondary | ICD-10-CM | POA: Insufficient documentation

## 2021-05-17 DIAGNOSIS — R1011 Right upper quadrant pain: Secondary | ICD-10-CM | POA: Insufficient documentation

## 2021-05-17 DIAGNOSIS — K810 Acute cholecystitis: Secondary | ICD-10-CM | POA: Diagnosis not present

## 2021-05-17 DIAGNOSIS — K8000 Calculus of gallbladder with acute cholecystitis without obstruction: Principal | ICD-10-CM | POA: Diagnosis present

## 2021-05-17 DIAGNOSIS — Z83438 Family history of other disorder of lipoprotein metabolism and other lipidemia: Secondary | ICD-10-CM

## 2021-05-17 DIAGNOSIS — F17210 Nicotine dependence, cigarettes, uncomplicated: Secondary | ICD-10-CM | POA: Diagnosis present

## 2021-05-17 DIAGNOSIS — Z20822 Contact with and (suspected) exposure to covid-19: Secondary | ICD-10-CM | POA: Diagnosis present

## 2021-05-17 DIAGNOSIS — Z807 Family history of other malignant neoplasms of lymphoid, hematopoietic and related tissues: Secondary | ICD-10-CM

## 2021-05-17 DIAGNOSIS — Z86718 Personal history of other venous thrombosis and embolism: Secondary | ICD-10-CM

## 2021-05-17 DIAGNOSIS — Z86711 Personal history of pulmonary embolism: Secondary | ICD-10-CM

## 2021-05-17 LAB — CBC WITH DIFFERENTIAL/PLATELET
Abs Immature Granulocytes: 0.02 10*3/uL (ref 0.00–0.07)
Basophils Absolute: 0.1 10*3/uL (ref 0.0–0.1)
Basophils Relative: 1 %
Eosinophils Absolute: 0.1 10*3/uL (ref 0.0–0.5)
Eosinophils Relative: 2 %
HCT: 46.8 % — ABNORMAL HIGH (ref 36.0–46.0)
Hemoglobin: 15.7 g/dL — ABNORMAL HIGH (ref 12.0–15.0)
Immature Granulocytes: 0 %
Lymphocytes Relative: 26 %
Lymphs Abs: 2 10*3/uL (ref 0.7–4.0)
MCH: 30.3 pg (ref 26.0–34.0)
MCHC: 33.5 g/dL (ref 30.0–36.0)
MCV: 90.3 fL (ref 80.0–100.0)
Monocytes Absolute: 0.3 10*3/uL (ref 0.1–1.0)
Monocytes Relative: 4 %
Neutro Abs: 5 10*3/uL (ref 1.7–7.7)
Neutrophils Relative %: 67 %
Platelets: 194 10*3/uL (ref 150–400)
RBC: 5.18 MIL/uL — ABNORMAL HIGH (ref 3.87–5.11)
RDW: 12.3 % (ref 11.5–15.5)
WBC: 7.6 10*3/uL (ref 4.0–10.5)
nRBC: 0 % (ref 0.0–0.2)

## 2021-05-17 LAB — COMPREHENSIVE METABOLIC PANEL
ALT: 160 U/L — ABNORMAL HIGH (ref 0–44)
AST: 29 U/L (ref 15–41)
Albumin: 4.2 g/dL (ref 3.5–5.0)
Alkaline Phosphatase: 70 U/L (ref 38–126)
Anion gap: 7 (ref 5–15)
BUN: 11 mg/dL (ref 6–20)
CO2: 27 mmol/L (ref 22–32)
Calcium: 9 mg/dL (ref 8.9–10.3)
Chloride: 103 mmol/L (ref 98–111)
Creatinine, Ser: 0.79 mg/dL (ref 0.44–1.00)
GFR, Estimated: >60 mL/min (ref >60)
Glucose, Bld: 97 mg/dL (ref 70–99)
Potassium: 4.2 mmol/L (ref 3.5–5.1)
Sodium: 137 mmol/L (ref 135–145)
Total Bilirubin: 0.4 mg/dL (ref 0.3–1.2)
Total Protein: 7.7 g/dL (ref 6.5–8.1)

## 2021-05-17 LAB — RESP PANEL BY RT-PCR (FLU A&B, COVID) ARPGX2
Influenza A by PCR: NEGATIVE
Influenza B by PCR: NEGATIVE
SARS Coronavirus 2 by RT PCR: NEGATIVE

## 2021-05-17 MED ORDER — MORPHINE SULFATE (PF) 4 MG/ML IV SOLN
4.0000 mg | Freq: Once | INTRAVENOUS | Status: AC
  Administered 2021-05-17: 4 mg via INTRAVENOUS
  Filled 2021-05-17: qty 1

## 2021-05-17 MED ORDER — ACETAMINOPHEN 325 MG PO TABS
650.0000 mg | ORAL_TABLET | Freq: Four times a day (QID) | ORAL | Status: DC | PRN
  Administered 2021-05-19: 650 mg via ORAL
  Filled 2021-05-17 (×2): qty 2

## 2021-05-17 MED ORDER — NICOTINE 21 MG/24HR TD PT24
21.0000 mg | MEDICATED_PATCH | Freq: Every day | TRANSDERMAL | Status: DC
  Administered 2021-05-17 – 2021-05-19 (×3): 21 mg via TRANSDERMAL
  Filled 2021-05-17 (×3): qty 1

## 2021-05-17 MED ORDER — HYDROMORPHONE HCL 1 MG/ML IJ SOLN
1.0000 mg | INTRAMUSCULAR | Status: DC | PRN
  Administered 2021-05-17 – 2021-05-19 (×8): 1 mg via INTRAVENOUS
  Filled 2021-05-17 (×8): qty 1

## 2021-05-17 MED ORDER — HYDROCORTISONE (PERIANAL) 2.5 % EX CREA
1.0000 "application " | TOPICAL_CREAM | Freq: Four times a day (QID) | CUTANEOUS | Status: DC | PRN
  Filled 2021-05-17: qty 28.35

## 2021-05-17 MED ORDER — SODIUM CHLORIDE 0.9 % IV SOLN
2.0000 g | INTRAVENOUS | Status: DC
  Administered 2021-05-17 – 2021-05-18 (×2): 2 g via INTRAVENOUS
  Filled 2021-05-17 (×2): qty 20

## 2021-05-17 MED ORDER — MENTHOL 3 MG MT LOZG
1.0000 | LOZENGE | OROMUCOSAL | Status: DC | PRN
  Filled 2021-05-17: qty 9

## 2021-05-17 MED ORDER — ALUM & MAG HYDROXIDE-SIMETH 200-200-20 MG/5ML PO SUSP: 30.0000 mL | Freq: Four times a day (QID) | ORAL | Status: DC | PRN

## 2021-05-17 MED ORDER — ONDANSETRON HCL 4 MG PO TABS: 4.0000 mg | ORAL_TABLET | Freq: Four times a day (QID) | ORAL | Status: DC | PRN

## 2021-05-17 MED ORDER — OXYCODONE HCL 5 MG PO TABS
5.0000 mg | ORAL_TABLET | ORAL | Status: DC | PRN
  Administered 2021-05-17 – 2021-05-19 (×3): 5 mg via ORAL
  Filled 2021-05-17 (×3): qty 1

## 2021-05-17 MED ORDER — ONDANSETRON HCL 4 MG/2ML IJ SOLN
4.0000 mg | Freq: Once | INTRAMUSCULAR | Status: AC
  Administered 2021-05-17: 4 mg via INTRAVENOUS
  Filled 2021-05-17: qty 2

## 2021-05-17 MED ORDER — SODIUM CHLORIDE 0.9 % IV SOLN: INTRAVENOUS | Status: DC

## 2021-05-17 MED ORDER — HEPARIN SODIUM (PORCINE) 5000 UNIT/ML IJ SOLN
5000.0000 [IU] | Freq: Three times a day (TID) | INTRAMUSCULAR | Status: DC
  Administered 2021-05-17 – 2021-05-19 (×4): 5000 [IU] via SUBCUTANEOUS
  Filled 2021-05-17 (×5): qty 1

## 2021-05-17 MED ORDER — ACETAMINOPHEN 500 MG PO TABS
1000.0000 mg | ORAL_TABLET | Freq: Four times a day (QID) | ORAL | Status: DC
  Administered 2021-05-17 – 2021-05-19 (×5): 1000 mg via ORAL
  Filled 2021-05-17 (×6): qty 2

## 2021-05-17 MED ORDER — ONDANSETRON HCL 4 MG/2ML IJ SOLN
4.0000 mg | Freq: Four times a day (QID) | INTRAMUSCULAR | Status: DC | PRN
  Administered 2021-05-19: 4 mg via INTRAVENOUS
  Filled 2021-05-17: qty 2

## 2021-05-17 MED ORDER — SODIUM CHLORIDE 0.9 % IV BOLUS
1000.0000 mL | Freq: Once | INTRAVENOUS | Status: AC
  Administered 2021-05-17: 1000 mL via INTRAVENOUS

## 2021-05-17 MED ORDER — PHENOL 1.4 % MT LIQD
1.0000 | OROMUCOSAL | Status: DC | PRN
  Filled 2021-05-17: qty 177

## 2021-05-17 MED ORDER — ACETAMINOPHEN 650 MG RE SUPP: 650.0000 mg | Freq: Four times a day (QID) | RECTAL | Status: DC | PRN

## 2021-05-17 MED ORDER — GUAIFENESIN-DM 100-10 MG/5ML PO SYRP: 10.0000 mL | ORAL_SOLUTION | ORAL | Status: DC | PRN

## 2021-05-17 MED ORDER — ONDANSETRON 4 MG PO TBDP: 4.0000 mg | ORAL_TABLET | Freq: Three times a day (TID) | ORAL | Status: DC | PRN

## 2021-05-17 MED ORDER — METRONIDAZOLE 500 MG/100ML IV SOLN
500.0000 mg | Freq: Two times a day (BID) | INTRAVENOUS | Status: DC
  Administered 2021-05-17 – 2021-05-19 (×4): 500 mg via INTRAVENOUS
  Filled 2021-05-17 (×4): qty 100

## 2021-05-17 NOTE — Progress Notes (Signed)
Called regarding [employer]. Patient was initially seen in the ED on 05/16/2021 for right upper quadrant abdominal pain, nausea, and vomiting.  During this initial evaluation, she was unable to receive CT abdomen and pelvis or abdominal ultrasound, so she followed up this morning for abdominal ultrasound.  She was then sent back to the emergency department after ultrasound demonstrated cholelithiasis with trace pericholecystic fluid, concerning for acute cholecystitis.  She has no leukocytosis, WBC 7.6.  Her only lab abnormality is an ALT of 160, which is down from 191 yesterday.  Remaining LFTs and total bilirubin are within normal limits.  Patient attempted to tolerate some oral intake, but had worsening abdominal pain with a grilled chicken sandwich.    - She will be admitted overnight to internal medicine with plans for laparoscopic cholecystectomy tomorrow, 05/18/2021.   - She may have clear liquids today, but NPO after midnight. - PRN pain control and antiemetics - I will plan to see the patient first thing in the morning and obtain consent at that time.  Please call with any questions or concerns.  Graciella Freer, DO [facility] [address] [place],  [phone] (office)
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
## 2021-05-17 NOTE — ED Triage Notes (Signed)
Return patient had ultrasound this am. Upper abdominal pain

## 2021-05-17 NOTE — ED Notes (Signed)
Dr Tat in to see pt.  

## 2021-05-17 NOTE — ED Provider Notes (Signed)
° °  Patient seen here yesterday for right upper quadrant pain and outpatient right upper quadrant ultrasound was ordered.  Patient returned today for scheduled imaging.  Ultrasound results reviewed by me, shows cholelithiasis with minimal pericholecystic fluid suggestive of acute cholecystitis.  Discussed findings with patient.  She continues to have right upper quadrant pain that radiates to her flank. Appears uncomfortable.    Will have patient check back in today for pain control and I will consult with general surgery.  Patient agreeable to plan  I have reviewed laboratory studies from yesterday's ER visit.  There was no leukocytosis.  LFTs showed elevated ALT at 191 alk phos and AST were unremarkable.  Total bilirubin was 0.4.  Lipase was unremarkable.   US Abdomen Limited RUQ/Gall Gladder  Result Date: 05/17/2021 CLINICAL DATA:  Right upper quadrant pain for 2 weeks. EXAM: ULTRASOUND ABDOMEN LIMITED RIGHT UPPER QUADRANT COMPARISON:  None. FINDINGS: Gallbladder: Multiple shadowing stones are present. This is essentially a wall echo complex. The sonographer reports a sonographic Murphy sign. Stones measure up to 1.4 cm. Common bile duct: Diameter: 5 mm, within normal limits. Liver: The liver is somewhat echogenic. No discrete lesions are present. Portal vein is patent on color Doppler imaging with normal direction of blood flow towards the liver. Other: A right pleural effusion is noted. A node is identified in the porta hepatis measuring 1.4 x 0.9 x 1.7 cm. IMPRESSION: 1. Cholelithiasis with sonographic Murphy sign and minimal pericholecystic fluid suggesting acute cholecystitis. 2. Right pleural effusion. 3. Indeterminate node in the porta hepatis. Electronically Signed   By: San Morelle M.D.   On: 05/17/2021 10:13      Kem Parkinson, PA-C 05/17/21 1036    Hayden Rasmussen, MD 05/17/21 [id number]

## 2021-05-17 NOTE — ED Notes (Signed)
Family to get pt grilled chicken sandwich.

## 2021-05-17 NOTE — H&P (Addendum)
History and Physical  Ann Hoover GTX:[id number] DOB: 12/01/1980 DOA: 05/17/2021   PCP: Pcp, No   Patient coming from: Home  Chief Complaint: abdominal pain with n/v  HPI:  Ann Hoover is a 41 y.o. 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  She states that it has been constant and worsening in severity.  It is getting more severe as time passes.  She began having nausea and vomiting on 05/13/2021.  Nausea and vomiting appears to be precipitated by food intake, particularly solids.  She denies any fevers, chills, headache, neck pain, chest pain, shortness breath, hematemesis, diarrhea, hematochezia, melena, dysuria. On 05/16/2021, patient visited the emergency department, was instructed to follow-up outpatient for ultrasound.  She represented on 05/17/2021 because of abdominal pain and request for ultrasound.  This was performed and showed cholelithiasis with sonographic Murphy sign.  There was minimal pericholecystic fluid.  There was concern for cholecystitis.  General surgery was contacted.  Patient was given a fluid challenge which resulted in nausea, vomiting, and worsening abdominal pain.  Plans are for possible laparoscopic cholecystectomy on 05/18/2021.  ED Patient was afebrile and hemodynamically stable with oxygen saturation 100% room air.  Sodium 137, potassium 4.2, bicarbonate 27, BUN 11, creatinine 0.79.  AST 29, ALT 160, alk phosphatase 70, total bilirubin 0.4.  WBC 7.6, hemoglobin 15.7, platelets 194,000.  Assessment/Plan: Symptomatic cholelithiasis -Appreciate general surgery -Plans for possible laparoscopic cholecystectomy 05/18/2021 -N.p.o. after midnight -Clear liquids for now -IV fluids -Judicious opioids -empiric ceftriaxone and metronidazole      Past Medical History:  Diagnosis Date   Chronic back pain    DVT  (deep venous thrombosis) ([place])    Herniated disc    Pulmonary embolus ([facility])    Past Surgical History:  Procedure Laterality Date   DILITATION & CURRETTAGE/HYSTROSCOPY WITH NOVASURE ABLATION N/A 06/07/2016   Procedure: DILATATION & CURETTAGE/HYSTEROSCOPY WITH NOVASURE ABLATION;  Surgeon: Florian Buff, MD;  Location: [facility];  Service: Gynecology;  Laterality: N/A;   TUBAL LIGATION     Social History:  reports that she has been smoking cigarettes. She has a 18.00 pack-year smoking history. She has never used smokeless tobacco. She reports current alcohol use. She reports that she does not use drugs.   Family History  Problem Relation Age of Onset   Hyperlipidemia Mother    Diabetes Father    Cancer Father        [place]   COPD Father    Cirrhosis Father    Pancreatic cancer Maternal Grandmother    Colon cancer Paternal Grandmother      No Known Allergies   Prior to Admission medications   Medication Sig Start Date End Date Taking? Authorizing Provider  acetaminophen (TYLENOL) 325 MG tablet Take 2 tablets (650 mg total) by mouth every 6 (six) hours. 12/12/17   Ainsley Spinner, PA-C  alum & mag hydroxide-simeth (MAALOX MULTI SYMPTOM MAX ST) 248-250-03 MG/5ML suspension Take 15 mLs by mouth every 6 (six) hours as needed for indigestion. 05/16/21   Caccavale, Sophia, PA-C  amphetamine-dextroamphetamine (ADDERALL) 20 MG tablet Take 10 mg by mouth 3 (three) times daily.     [provider]  docusate sodium (COLACE) 100 MG capsule Take 1 capsule (100 mg total) by mouth 2 (two) times daily. 12/12/17   Ainsley Spinner, PA-C  enoxaparin (LOVENOX) 40 MG/0.4ML injection  Inject 0.4 mLs (40 mg total) into the skin daily. 12/12/17   Ainsley Spinner, PA-C  HYDROcodone-acetaminophen (NORCO/VICODIN) 5-325 MG tablet Take 1 tablet by mouth every 6 (six) hours as needed. 05/16/21   Caccavale, Sophia, PA-C  HYDROmorphone (DILAUDID) 2 MG tablet Take 1-2 tablets (2-4 mg total) by mouth every 8 (eight) hours as  needed for moderate pain or severe pain. 12/12/17   Ainsley Spinner, PA-C  ketorolac (TORADOL) 10 MG tablet Take 1 tablet (10 mg total) by mouth every 6 (six) hours as needed for moderate pain. 12/12/17   Ainsley Spinner, PA-C  methocarbamol (ROBAXIN) 750 MG tablet Take 1 tablet (750 mg total) by mouth every 8 (eight) hours as needed for muscle spasms. 12/12/17   Ainsley Spinner, PA-C  ondansetron (ZOFRAN-ODT) 4 MG disintegrating tablet Take 1 tablet (4 mg total) by mouth every 8 (eight) hours as needed for nausea or vomiting. 05/16/21   Caccavale, Sophia, PA-C  pantoprazole (PROTONIX) 20 MG tablet Take 1 tablet (20 mg total) by mouth daily. 05/16/21   Caccavale, Sophia, PA-C    Review of Systems:  Constitutional:  No weight loss, night sweats, Fevers, chills, fatigue.  Head&Eyes: No headache.  No vision loss.  No eye pain or scotoma ENT:  No Difficulty swallowing,Tooth/dental problems,Sore throat,  No ear ache, post nasal drip,  Cardio-vascular:  No chest pain, Orthopnea, PND, swelling in lower extremities,  dizziness, palpitations  GI:  No   diarrhea, loss of appetite, hematochezia, melena, heartburn, indigestion, Resp:  No shortness of breath with exertion or at rest. No cough. No coughing up of blood .No wheezing.No chest wall deformity  Skin:  no rash or lesions.  GU:  no dysuria, change in color of urine, no urgency or frequency. No flank pain.  Musculoskeletal:  No joint pain or swelling. No decreased range of motion. No back pain.  Psych:  No change in mood or affect. No depression or anxiety. Neurologic: No headache, no dysesthesia, no focal weakness, no vision loss. No syncope  Physical Exam: Vitals:   05/17/21 1120 05/17/21 1441  BP: 120/72 137/66  Pulse: 67 60  Resp: 15 16  Temp: 98.6 F (37 C)   TempSrc: Oral   SpO2: 100% 100%   General:  A&O x 3, NAD, nontoxic, pleasant/cooperative Head/Eye: No conjunctival hemorrhage, no icterus, [place]/AT, No nystagmus ENT:  No icterus,  No  thrush, good dentition, no pharyngeal exudate Neck:  No masses, no lymphadenpathy, no bruits CV:  RRR, no rub, no gallop, no S3 Lung:  CTAB, good air movement, no wheeze, no rhonchi Abdomen: soft/RUQ pain, +BS, nondistended, no peritoneal signs Ext: No cyanosis, No rashes, No petechiae, No lymphangitis, No edema Neuro: CNII-XII intact, strength 4/5 in bilateral upper and lower extremities, no dysmetria  Labs on Admission:  Basic Metabolic Panel: Recent Labs  Lab 05/16/21 1108 05/17/21 1145  NA 138 137  K 3.7 4.2  CL 102 103  CO2 28 27  GLUCOSE 78 97  BUN 10 11  CREATININE 0.78 0.79  CALCIUM 8.6* 9.0   Liver Function Tests: Recent Labs  Lab 05/16/21 1108 05/17/21 1145  AST 28 29  ALT 191* 160*  ALKPHOS 66 70  BILITOT 0.4 0.4  PROT 6.6 7.7  ALBUMIN 3.7 4.2   Recent Labs  Lab 05/16/21 1108  LIPASE 33   No results for input(s): AMMONIA in the last 168 hours. CBC: Recent Labs  Lab 05/16/21 1108 05/17/21 1145  WBC 6.9 7.6  NEUTROABS  --  5.0  HGB 14.3 15.7*  HCT 42.9 46.8*  MCV 90.1 90.3  PLT 183 194   Coagulation Profile: No results for input(s): INR, PROTIME in the last 168 hours. Cardiac Enzymes: No results for input(s): CKTOTAL, CKMB, CKMBINDEX, TROPONINI in the last 168 hours. BNP: Invalid input(s): POCBNP CBG: No results for input(s): GLUCAP in the last 168 hours. Urine analysis:    Component Value Date/Time   COLORURINE YELLOW 05/16/2021 [place] 05/16/2021 1123   LABSPEC 1.020 05/16/2021 1123   PHURINE 7.0 05/16/2021 1123   GLUCOSEU NEGATIVE 05/16/2021 1123   HGBUR NEGATIVE 05/16/2021 1123   BILIRUBINUR NEGATIVE 05/16/2021 1123   KETONESUR NEGATIVE 05/16/2021 1123   PROTEINUR NEGATIVE 05/16/2021 1123   UROBILINOGEN 4.0 (H) 11/01/2014 1430   NITRITE NEGATIVE 05/16/2021 1123   LEUKOCYTESUR NEGATIVE 05/16/2021 1123   Sepsis Labs: @LABRCNTIP (procalcitonin:4,lacticidven:4) ) Recent Results (from the past 240 hour(s))  Resp  Panel by RT-PCR (Flu A&B, Covid) Nasopharyngeal Swab     Status: None   Collection Time: 05/17/21 11:59 AM   Specimen: Nasopharyngeal Swab; Nasopharyngeal(NP) swabs in vial transport medium  Result Value Ref Range Status   SARS Coronavirus 2 by RT PCR NEGATIVE NEGATIVE Final    Comment: (NOTE) SARS-CoV-2 target nucleic acids are NOT DETECTED.  The SARS-CoV-2 RNA is generally detectable in upper respiratory specimens during the acute phase of infection. The lowest concentration of SARS-CoV-2 viral copies this assay can detect is 138 copies/mL. A negative result does not preclude SARS-Cov-2 infection and should not be used as the sole basis for treatment or other patient management decisions. A negative result may occur with  improper specimen collection/handling, submission of specimen other than nasopharyngeal swab, presence of viral mutation(s) within the areas targeted by this assay, and inadequate number of viral copies(<138 copies/mL). A negative result must be combined with clinical observations, patient history, and epidemiological information. The expected result is Negative.  Fact Sheet for Patients:  EntrepreneurPulse.com.au  Fact Sheet for Healthcare Providers:  IncredibleEmployment.be  This test is no t yet approved or cleared by the [place] FDA and  has been authorized for detection and/or diagnosis of SARS-CoV-2 by FDA under an Emergency Use Authorization (EUA). This EUA will remain  in effect (meaning this test can be used) for the duration of the COVID-19 declaration under Section 564(b)(1) of the Act, 21 U.S.C.section 360bbb-3(b)(1), unless the authorization is terminated  or revoked sooner.       Influenza A by PCR NEGATIVE NEGATIVE Final   Influenza B by PCR NEGATIVE NEGATIVE Final    Comment: (NOTE) The Xpert Xpress SARS-CoV-2/FLU/RSV plus assay is intended as an aid in the diagnosis of influenza from Nasopharyngeal  swab specimens and should not be used as a sole basis for treatment. Nasal washings and aspirates are unacceptable for Xpert Xpress SARS-CoV-2/FLU/RSV testing.  Fact Sheet for Patients: EntrepreneurPulse.com.au  Fact Sheet for Healthcare Providers: IncredibleEmployment.be  This test is not yet approved or cleared by the [place] FDA and has been authorized for detection and/or diagnosis of SARS-CoV-2 by FDA under an Emergency Use Authorization (EUA). This EUA will remain in effect (meaning this test can be used) for the duration of the COVID-19 declaration under Section 564(b)(1) of the Act, 21 U.S.C. section 360bbb-3(b)(1), unless the authorization is terminated or revoked.  Performed at [facility], [address], [place], [place] [address]      Radiological Exams on Admission: US Abdomen Limited RUQ/Gall Gladder  Result Date: 05/17/2021 CLINICAL DATA:  Right upper  quadrant pain for 2 weeks. EXAM: ULTRASOUND ABDOMEN LIMITED RIGHT UPPER QUADRANT COMPARISON:  None. FINDINGS: Gallbladder: Multiple shadowing stones are present. This is essentially a wall echo complex. The sonographer reports a sonographic Murphy sign. Stones measure up to 1.4 cm. Common bile duct: Diameter: 5 mm, within normal limits. Liver: The liver is somewhat echogenic. No discrete lesions are present. Portal vein is patent on color Doppler imaging with normal direction of blood flow towards the liver. Other: A right pleural effusion is noted. A node is identified in the porta hepatis measuring 1.4 x 0.9 x 1.7 cm. IMPRESSION: 1. Cholelithiasis with sonographic Murphy sign and minimal pericholecystic fluid suggesting acute cholecystitis. 2. Right pleural effusion. 3. Indeterminate node in the porta hepatis. Electronically Signed   By: San Morelle M.D.   On: 05/17/2021 10:13    EKG: Independently reviewed. none    Time spent:60 minutes Code Status:   FULL Family  Communication:  No Family at bedside Disposition Plan: expect 2-3 day hospitalization Consults called: general surgery  DVT Prophylaxis: [place] Lovenox  Orson Eva, DO  [facility] Pager [phone]  If 7PM-7AM, please contact night-coverage [email or web address] Password [email or web address] 05/17/2021, 3:03 PM

## 2021-05-17 NOTE — ED Notes (Signed)
Pt ambulating in hall without difficulty.  Walking to drink machine to get clear liquids.

## 2021-05-17 NOTE — ED Provider Notes (Signed)
Ann Hoover Provider Note   CSN: [id number] Arrival date & time: 05/17/21  1051     History  No chief complaint on file.   Ann Hoover is a 41 y.o. female.  HPI      PENELOPI Hoover is a 41 y.o. female who was seen in this emergency department yesterday and evaluated for right upper quadrant abdominal pain nausea and vomiting.  Outpatient ultrasound of the abdomen was ordered and patient return for imaging today.  She continues to have right upper quadrant pain.  No fever or chills.  She does endorse increased pain with eating and drinking alcohol.      Home Medications Prior to Admission medications   Medication Sig Start Date End Date Taking? Authorizing Provider  acetaminophen (TYLENOL) 325 MG tablet Take 2 tablets (650 mg total) by mouth every 6 (six) hours. 12/12/17   Ainsley Spinner, PA-C  alum & mag hydroxide-simeth (MAALOX MULTI SYMPTOM MAX ST) 970-263-78 MG/5ML suspension Take 15 mLs by mouth every 6 (six) hours as needed for indigestion. 05/16/21   Caccavale, Sophia, PA-C  amphetamine-dextroamphetamine (ADDERALL) 20 MG tablet Take 10 mg by mouth 3 (three) times daily.     [provider]  docusate sodium (COLACE) 100 MG capsule Take 1 capsule (100 mg total) by mouth 2 (two) times daily. 12/12/17   Ainsley Spinner, PA-C  enoxaparin (LOVENOX) 40 MG/0.4ML injection Inject 0.4 mLs (40 mg total) into the skin daily. 12/12/17   Ainsley Spinner, PA-C  HYDROcodone-acetaminophen (NORCO/VICODIN) 5-325 MG tablet Take 1 tablet by mouth every 6 (six) hours as needed. 05/16/21   Caccavale, Sophia, PA-C  HYDROmorphone (DILAUDID) 2 MG tablet Take 1-2 tablets (2-4 mg total) by mouth every 8 (eight) hours as needed for moderate pain or severe pain. 12/12/17   Ainsley Spinner, PA-C  ketorolac (TORADOL) 10 MG tablet Take 1 tablet (10 mg total) by mouth every 6 (six) hours as needed for moderate pain. 12/12/17   Ainsley Spinner, PA-C  methocarbamol (ROBAXIN) 750 MG tablet Take 1 tablet (750  mg total) by mouth every 8 (eight) hours as needed for muscle spasms. 12/12/17   Ainsley Spinner, PA-C  ondansetron (ZOFRAN-ODT) 4 MG disintegrating tablet Take 1 tablet (4 mg total) by mouth every 8 (eight) hours as needed for nausea or vomiting. 05/16/21   Caccavale, Sophia, PA-C  pantoprazole (PROTONIX) 20 MG tablet Take 1 tablet (20 mg total) by mouth daily. 05/16/21   Caccavale, Sophia, PA-C     Surgical history is significant for tubal ligation   Allergies    Patient has no known allergies.    Review of Systems   Review of Systems  Gastrointestinal:  Positive for abdominal pain, nausea and vomiting.  All other systems reviewed and are negative.  Physical Exam Updated Vital Signs BP 120/72 (BP Location: Right Arm)    Pulse 67    Temp 98.6 F (37 C) (Oral)    Resp 15    SpO2 100%  Physical Exam Vitals and nursing note reviewed.  Constitutional:      General: She is not in acute distress.    Appearance: Normal appearance. She is not ill-appearing or toxic-appearing.  HENT:     Head: Normocephalic.     Mouth/Throat:     Mouth: Mucous membranes are moist.  Eyes:     General: No scleral icterus. Cardiovascular:     Rate and Rhythm: Normal rate and regular rhythm.     Pulses: Normal pulses.  Pulmonary:  Effort: Pulmonary effort is normal.     Breath sounds: Normal breath sounds. No wheezing.  Abdominal:     Palpations: Abdomen is soft.     Tenderness: There is abdominal tenderness. There is no guarding or rebound.     Comments: Right upper quadrant tenderness.  No abdominal distention, guarding or rebound.  No CVA tenderness  Musculoskeletal:        General: Normal range of motion.     Right lower leg: No edema.     Left lower leg: No edema.  Skin:    General: Skin is warm.     Findings: No rash.  Neurological:     General: No focal deficit present.     Mental Status: She is alert.     Sensory: No sensory deficit.     Motor: No weakness.    ED Results / Procedures /  Treatments   Labs (all labs ordered are listed, but only abnormal results are displayed) Labs Reviewed  COMPREHENSIVE METABOLIC PANEL - Abnormal; Notable for the following components:      Result Value   ALT 160 (*)    All other components within normal limits  CBC WITH DIFFERENTIAL/PLATELET - Abnormal; Notable for the following components:   RBC 5.18 (*)    Hemoglobin 15.7 (*)    HCT 46.8 (*)    All other components within normal limits  RESP PANEL BY RT-PCR (FLU A&B, COVID) [id number]  ETHANOL    EKG None  Radiology US Abdomen Limited RUQ/Gall Gladder  Result Date: 05/17/2021 CLINICAL DATA:  Right upper quadrant pain for 2 weeks. EXAM: ULTRASOUND ABDOMEN LIMITED RIGHT UPPER QUADRANT COMPARISON:  None. FINDINGS: Gallbladder: Multiple shadowing stones are present. This is essentially a wall echo complex. The sonographer reports a sonographic Murphy sign. Stones measure up to 1.4 cm. Common bile duct: Diameter: 5 mm, within normal limits. Liver: The liver is somewhat echogenic. No discrete lesions are present. Portal vein is patent on color Doppler imaging with normal direction of blood flow towards the liver. Other: A right pleural effusion is noted. A node is identified in the porta hepatis measuring 1.4 x 0.9 x 1.7 cm. IMPRESSION: 1. Cholelithiasis with sonographic Murphy sign and minimal pericholecystic fluid suggesting acute cholecystitis. 2. Right pleural effusion. 3. Indeterminate node in the porta hepatis. Electronically Signed   By: San Morelle M.D.   On: 05/17/2021 10:13    Procedures Procedures    Medications Ordered in ED Medications  morphine 4 MG/ML injection 4 mg (4 mg Intravenous Given 05/17/21 1148)  ondansetron (ZOFRAN) injection 4 mg (4 mg Intravenous Given 05/17/21 1149)  sodium chloride 0.9 % bolus 1,000 mL (1,000 mLs Intravenous New Bag/Given 05/17/21 1150)    ED Course/ Medical Decision Making/ A&P                           Medical Decision Making  Patient  seen and evaluated here yesterday for right upper quadrant abdominal pain.  Pain has been present for years but worse for several days.  Has been recently associated with nausea and vomiting.  Pain radiates from right upper abdomen into flank.  CT and ultrasound was unavailable from ER visit yesterday.  Patient was scheduled for ultrasound this morning.  I have personally reviewed ultrasound images and interpretation.  Findings suggestive of cholelithiasis with sonographic Murphy sign and minimal pericholecystic fluid suggestive of acute cholecystitis.  On exam, patient has tenderness of the right upper  quadrant without guarding or rebound.  No active vomiting.  Vital signs have been reviewed.  She appears uncomfortable but does not appear septic.  Plan today includes to have patient check back into the emergency department and will address her pain with IV medications and recheck laboratory studies to include LFTs.  I will consult with general surgery.  On recheck, patient resting comfortably.  Pain has improved.  Today's laboratory studies interpreted by me, no evidence of leukocytosis.  LFTs show mildly elevated ALT at 160, down from 191 yesterday.    Discussed findings with general surgery, Dr. Okey Dupre who will review ultrasound findings and call back.  Work-up including imaging reviewed by Dr. Okey Dupre, recommends to trial with solid foods and reassess for pain.  On recheck, patient has ate grilled chicken.  Pain has returned.  No active vomiting.  I have discussed this with general surgery, Dr. Okey Dupre who asked that patient be admitted to hospitalist service and she will see tomorrow and plan for cholecystectomy.  Patient may have clears if tolerated this afternoon, n.p.o. after midnight.  I have discussed treatment with patient and she is agreeable to plan for admission.  Discussed findings with [facility] hospital[facility], Dr. Carles Collet who agrees to admit.           Final Clinical  Impression(s) / ED Diagnoses Final diagnoses:  Acute cholecystitis    Rx / DC Orders ED Discharge Orders     None         Kem Parkinson, PA-C 05/17/21 1509    Hayden Rasmussen, MD 05/17/21 [phone]

## 2021-05-18 ENCOUNTER — Observation Stay (HOSPITAL_COMMUNITY): Payer: Medicaid Other | Admitting: Certified Registered Nurse Anesthetist

## 2021-05-18 ENCOUNTER — Encounter (HOSPITAL_COMMUNITY): Payer: Self-pay | Admitting: Internal Medicine

## 2021-05-18 ENCOUNTER — Encounter (HOSPITAL_COMMUNITY)
Admission: EM | Disposition: A | Discharge status: Home / Self Care | Payer: Self-pay | Source: Home / Self Care | Attending: General Surgery

## 2021-05-18 ENCOUNTER — Ambulatory Visit: Payer: Medicaid Other | Admitting: Surgery

## 2021-05-18 DIAGNOSIS — K8000 Calculus of gallbladder with acute cholecystitis without obstruction: Secondary | ICD-10-CM | POA: Diagnosis not present

## 2021-05-18 DIAGNOSIS — K810 Acute cholecystitis: Secondary | ICD-10-CM

## 2021-05-18 DIAGNOSIS — K8020 Calculus of gallbladder without cholecystitis without obstruction: Secondary | ICD-10-CM | POA: Diagnosis not present

## 2021-05-18 DIAGNOSIS — K8010 Calculus of gallbladder with chronic cholecystitis without obstruction: Secondary | ICD-10-CM | POA: Diagnosis not present

## 2021-05-18 DIAGNOSIS — Z79899 Other long term (current) drug therapy: Secondary | ICD-10-CM | POA: Diagnosis not present

## 2021-05-18 DIAGNOSIS — Z86711 Personal history of pulmonary embolism: Secondary | ICD-10-CM | POA: Diagnosis not present

## 2021-05-18 DIAGNOSIS — Z83438 Family history of other disorder of lipoprotein metabolism and other lipidemia: Secondary | ICD-10-CM | POA: Diagnosis not present

## 2021-05-18 DIAGNOSIS — Z800 Family history of malignant neoplasm of digestive organs: Secondary | ICD-10-CM | POA: Diagnosis not present

## 2021-05-18 DIAGNOSIS — Z86718 Personal history of other venous thrombosis and embolism: Secondary | ICD-10-CM | POA: Diagnosis not present

## 2021-05-18 DIAGNOSIS — Z20822 Contact with and (suspected) exposure to covid-19: Secondary | ICD-10-CM | POA: Diagnosis not present

## 2021-05-18 DIAGNOSIS — F17210 Nicotine dependence, cigarettes, uncomplicated: Secondary | ICD-10-CM | POA: Diagnosis not present

## 2021-05-18 DIAGNOSIS — Z825 Family history of asthma and other chronic lower respiratory diseases: Secondary | ICD-10-CM | POA: Diagnosis not present

## 2021-05-18 DIAGNOSIS — Z833 Family history of diabetes mellitus: Secondary | ICD-10-CM | POA: Diagnosis not present

## 2021-05-18 DIAGNOSIS — Z807 Family history of other malignant neoplasms of lymphoid, hematopoietic and related tissues: Secondary | ICD-10-CM | POA: Diagnosis not present

## 2021-05-18 HISTORY — PX: CHOLECYSTECTOMY: SHX55

## 2021-05-18 LAB — CBC
HCT: 43.4 % (ref 36.0–46.0)
Hemoglobin: 13.9 g/dL (ref 12.0–15.0)
MCH: 29.1 pg (ref 26.0–34.0)
MCHC: 32 g/dL (ref 30.0–36.0)
MCV: 90.8 fL (ref 80.0–100.0)
Platelets: 165 10*3/uL (ref 150–400)
RBC: 4.78 MIL/uL (ref 3.87–5.11)
RDW: 12.2 % (ref 11.5–15.5)
WBC: 5.7 10*3/uL (ref 4.0–10.5)
nRBC: 0 % (ref 0.0–0.2)

## 2021-05-18 LAB — COMPREHENSIVE METABOLIC PANEL
ALT: 112 U/L — ABNORMAL HIGH (ref 0–44)
AST: 30 U/L (ref 15–41)
Albumin: 3.5 g/dL (ref 3.5–5.0)
Alkaline Phosphatase: 55 U/L (ref 38–126)
Anion gap: 7 (ref 5–15)
BUN: 8 mg/dL (ref 6–20)
CO2: 24 mmol/L (ref 22–32)
Calcium: 8.4 mg/dL — ABNORMAL LOW (ref 8.9–10.3)
Chloride: 108 mmol/L (ref 98–111)
Creatinine, Ser: 0.75 mg/dL (ref 0.44–1.00)
GFR, Estimated: >60 mL/min (ref >60)
Glucose, Bld: 94 mg/dL (ref 70–99)
Potassium: 3.9 mmol/L (ref 3.5–5.1)
Sodium: 139 mmol/L (ref 135–145)
Total Bilirubin: 0.7 mg/dL (ref 0.3–1.2)
Total Protein: 6.1 g/dL — ABNORMAL LOW (ref 6.5–8.1)

## 2021-05-18 LAB — SURGICAL PCR SCREEN
MRSA, PCR: NEGATIVE
Staphylococcus aureus: NEGATIVE

## 2021-05-18 LAB — HIV ANTIBODY (ROUTINE TESTING W REFLEX): HIV Screen 4th Generation wRfx: NONREACTIVE

## 2021-05-18 SURGERY — LAPAROSCOPIC CHOLECYSTECTOMY
Anesthesia: General | Site: Abdomen

## 2021-05-18 MED ORDER — SIMETHICONE 80 MG PO CHEW
80.0000 mg | CHEWABLE_TABLET | Freq: Four times a day (QID) | ORAL | Status: DC | PRN
  Administered 2021-05-18: 80 mg via ORAL
  Filled 2021-05-18: qty 1

## 2021-05-18 MED ORDER — CHLORHEXIDINE GLUCONATE 0.12 % MT SOLN: 15.0000 mL | Freq: Once | OROMUCOSAL | Status: DC

## 2021-05-18 MED ORDER — ROCURONIUM BROMIDE 10 MG/ML (PF) SYRINGE
PREFILLED_SYRINGE | INTRAVENOUS | Status: DC | PRN
  Administered 2021-05-18: 70 mg via INTRAVENOUS

## 2021-05-18 MED ORDER — BUPIVACAINE HCL (PF) 0.5 % IJ SOLN
INTRAMUSCULAR | Status: DC | PRN
  Administered 2021-05-18: 10 mL

## 2021-05-18 MED ORDER — ONDANSETRON HCL 4 MG/2ML IJ SOLN
INTRAMUSCULAR | Status: DC | PRN
  Administered 2021-05-18: 4 mg via INTRAVENOUS

## 2021-05-18 MED ORDER — SUGAMMADEX SODIUM 200 MG/2ML IV SOLN
INTRAVENOUS | Status: DC | PRN
  Administered 2021-05-18: 150 mg via INTRAVENOUS

## 2021-05-18 MED ORDER — SODIUM CHLORIDE 0.9 % IR SOLN
Status: DC | PRN
  Administered 2021-05-18: 1000 mL

## 2021-05-18 MED ORDER — ORAL CARE MOUTH RINSE: 15.0000 mL | Freq: Once | OROMUCOSAL | Status: DC

## 2021-05-18 MED ORDER — FENTANYL CITRATE (PF) 250 MCG/5ML IJ SOLN
INTRAMUSCULAR | Status: DC | PRN
  Administered 2021-05-18: 50 ug via INTRAVENOUS
  Administered 2021-05-18: 100 ug via INTRAVENOUS
  Administered 2021-05-18 (×2): 50 ug via INTRAVENOUS

## 2021-05-18 MED ORDER — MIDAZOLAM HCL 5 MG/5ML IJ SOLN
INTRAMUSCULAR | Status: DC | PRN
  Administered 2021-05-18: 2 mg via INTRAVENOUS

## 2021-05-18 MED ORDER — BUPIVACAINE HCL (PF) 0.5 % IJ SOLN
INTRAMUSCULAR | Status: AC
  Filled 2021-05-18: qty 30

## 2021-05-18 MED ORDER — PROPOFOL 10 MG/ML IV BOLUS
INTRAVENOUS | Status: AC
  Filled 2021-05-18: qty 20

## 2021-05-18 MED ORDER — ONDANSETRON HCL 4 MG PO TABS: 4.0000 mg | ORAL_TABLET | Freq: Four times a day (QID) | ORAL | 0 refills | Status: DC | PRN

## 2021-05-18 MED ORDER — HEMOSTATIC AGENTS (NO CHARGE) OPTIME
TOPICAL | Status: DC | PRN
  Administered 2021-05-18: 1

## 2021-05-18 MED ORDER — FENTANYL CITRATE (PF) 250 MCG/5ML IJ SOLN
INTRAMUSCULAR | Status: AC
  Filled 2021-05-18: qty 5

## 2021-05-18 MED ORDER — DEXAMETHASONE SODIUM PHOSPHATE 10 MG/ML IJ SOLN
INTRAMUSCULAR | Status: DC | PRN
  Administered 2021-05-18: 10 mg via INTRAVENOUS

## 2021-05-18 MED ORDER — KETOROLAC TROMETHAMINE 30 MG/ML IJ SOLN
INTRAMUSCULAR | Status: DC | PRN
Start: 2021-05-18 — End: 2021-05-18
  Administered 2021-05-18: 30 mg via INTRAVENOUS

## 2021-05-18 MED ORDER — ONDANSETRON HCL 4 MG/2ML IJ SOLN: 4.0000 mg | Freq: Once | INTRAMUSCULAR | Status: DC | PRN

## 2021-05-18 MED ORDER — FENTANYL CITRATE PF 50 MCG/ML IJ SOSY
25.0000 ug | PREFILLED_SYRINGE | INTRAMUSCULAR | Status: DC | PRN
  Administered 2021-05-18 (×3): 50 ug via INTRAVENOUS
  Filled 2021-05-18 (×3): qty 1

## 2021-05-18 MED ORDER — MIDAZOLAM HCL 2 MG/2ML IJ SOLN
INTRAMUSCULAR | Status: AC
  Filled 2021-05-18: qty 2

## 2021-05-18 MED ORDER — OXYCODONE HCL 5 MG PO TABS: 5.0000 mg | ORAL_TABLET | ORAL | 0 refills | Status: DC | PRN

## 2021-05-18 MED ORDER — LIDOCAINE 2% (20 MG/ML) 5 ML SYRINGE
INTRAMUSCULAR | Status: DC | PRN
  Administered 2021-05-18: 60 mg via INTRAVENOUS

## 2021-05-18 MED ORDER — PROPOFOL 10 MG/ML IV BOLUS
INTRAVENOUS | Status: DC | PRN
  Administered 2021-05-18: 140 mg via INTRAVENOUS

## 2021-05-18 MED ORDER — LACTATED RINGERS IV SOLN: INTRAVENOUS | Status: DC

## 2021-05-18 SURGICAL SUPPLY — 43 items
APPLICATOR ARISTA FLEXITIP XL (MISCELLANEOUS) ×1 IMPLANT
APPLIER CLIP ROT 10 11.4 M/L (STAPLE) ×2
BAG RETRIEVAL 10 (BASKET) ×2
BLADE SURG 15 STRL LF DISP TIS (BLADE) ×1 IMPLANT
BLADE SURG 15 STRL SS (BLADE) ×2
CHLORAPREP W/TINT 26 (MISCELLANEOUS) ×2 IMPLANT
CLIP APPLIE ROT 10 11.4 M/L (STAPLE) ×1 IMPLANT
CLOTH BEACON ORANGE TIMEOUT ST (SAFETY) ×2 IMPLANT
COVER LIGHT HANDLE STERIS (MISCELLANEOUS) ×4 IMPLANT
DECANTER SPIKE VIAL GLASS SM (MISCELLANEOUS) ×2 IMPLANT
DERMABOND ADVANCED (GAUZE/BANDAGES/DRESSINGS) ×1
DERMABOND ADVANCED .7 DNX12 (GAUZE/BANDAGES/DRESSINGS) ×1 IMPLANT
DRSG TEGADERM 2-3/8X2-3/4 SM (GAUZE/BANDAGES/DRESSINGS) ×4 IMPLANT
ELECT REM PT RETURN 9FT ADLT (ELECTROSURGICAL) ×2
ELECTRODE REM PT RTRN 9FT ADLT (ELECTROSURGICAL) ×1 IMPLANT
GAUZE 4X4 16PLY ~~LOC~~+RFID DBL (SPONGE) ×2 IMPLANT
GLOVE SURG ENC MOIS LTX SZ6.5 (GLOVE) ×2 IMPLANT
GLOVE SURG UNDER POLY LF SZ7 (GLOVE) ×8 IMPLANT
GOWN STRL REUS W/TWL LRG LVL3 (GOWN DISPOSABLE) ×6 IMPLANT
HEMOSTAT ARISTA ABSORB 3G PWDR (HEMOSTASIS) ×1 IMPLANT
HEMOSTAT SNOW SURGICEL 2X4 (HEMOSTASIS) ×2 IMPLANT
INST SET LAPROSCOPIC AP (KITS) ×2 IMPLANT
KIT TURNOVER KIT A (KITS) ×2 IMPLANT
MANIFOLD NEPTUNE II (INSTRUMENTS) ×2 IMPLANT
NDL INSUFFLATION 14GA 120MM (NEEDLE) ×1 IMPLANT
NEEDLE INSUFFLATION 14GA 120MM (NEEDLE) ×2 IMPLANT
NS IRRIG 1000ML POUR BTL (IV SOLUTION) ×2 IMPLANT
PACK LAP CHOLE LZT030E (CUSTOM PROCEDURE TRAY) ×2 IMPLANT
PAD ARMBOARD 7.5X6 YLW CONV (MISCELLANEOUS) ×2 IMPLANT
SET BASIN LINEN APH (SET/KITS/TRAYS/PACK) ×2 IMPLANT
SET TUBE SMOKE EVAC HIGH FLOW (TUBING) ×2 IMPLANT
SLEEVE ENDOPATH XCEL 5M (ENDOMECHANICALS) ×2 IMPLANT
SPONGE GAUZE 2X2 8PLY STRL LF (GAUZE/BANDAGES/DRESSINGS) ×4 IMPLANT
STAPLER VISISTAT (STAPLE) ×1 IMPLANT
SUT MNCRL AB 4-0 PS2 18 (SUTURE) ×4 IMPLANT
SUT VICRYL 0 UR6 27IN ABS (SUTURE) ×2 IMPLANT
SYS BAG RETRIEVAL 10MM (BASKET) ×2
SYSTEM BAG RETRIEVAL 10MM (BASKET) ×1 IMPLANT
TROCAR ENDO BLADELESS 11MM (ENDOMECHANICALS) ×2 IMPLANT
TROCAR XCEL NON-BLD 5MMX100MML (ENDOMECHANICALS) ×2 IMPLANT
TROCAR XCEL UNIV SLVE 11M 100M (ENDOMECHANICALS) ×2 IMPLANT
TUBE CONNECTING 12X1/4 (SUCTIONS) ×2 IMPLANT
WARMER LAPAROSCOPE (MISCELLANEOUS) ×2 IMPLANT

## 2021-05-18 NOTE — Progress Notes (Signed)
Progress Note    Ann Hoover  RAQ:[id number] DOB: 06/24/1980  DOA: 05/17/2021 PCP: Pcp, No      Brief Narrative:    Medical records reviewed and are as summarized below:  Ann Hoover is a 41 y.o. female with medical history significant for remote PE and DVT, who presented to the hospital with right upper quadrant abdominal pain.  She was found to have cholelithiasis and acute cholecystitis.      Assessment/Plan:   Principal Problem:   Acute cholecystitis Active Problems:   Symptomatic cholelithiasis   Body mass index is 22.81 kg/m.  Cholelithiasis and acute cholecystitis: Continue analgesics as needed for pain, antiemetics as needed for nausea/vomiting.  Plan for lap cholecystectomy today.  Follow-up with general surgeon.  Diet Order             Diet NPO time specified  Diet effective midnight                      Consultants: General surgeon  Procedures: None    Medications:    acetaminophen  1,000 mg Oral Q6H   heparin  5,000 Units Subcutaneous Q8H   nicotine  21 mg Transdermal Daily   Continuous Infusions:  sodium chloride 100 mL/hr at 05/17/21 1647   cefTRIAXone (ROCEPHIN)  IV Stopped (05/17/21 1728)   metronidazole 500 mg (05/18/21 0530)     Anti-infectives (From admission, onward)    Start     Dose/Rate Route Frequency Ordered Stop   05/17/21 1700  metroNIDAZOLE (FLAGYL) IVPB 500 mg        500 mg 100 mL/hr over 60 Minutes Intravenous Every 12 hours 05/17/21 1521     05/17/21 1600  cefTRIAXone (ROCEPHIN) 2 g in sodium chloride 0.9 % 100 mL IVPB        2 g 200 mL/hr over 30 Minutes Intravenous Every 24 hours 05/17/21 1521                Family Communication/Anticipated D/C date and plan/Code Status   DVT prophylaxis: heparin injection 5,000 Units Start: 05/17/21 2200     Code Status: Full Code  Family Communication: Plan discussed with his mother at the bedside Disposition Plan: Possible discharge to home  tomorrow   Status is: Observation  The patient will require care spanning > 2 midnights and should be moved to inpatient because: Requires surgery          Subjective:   C/o nausea and pain in the right side of the abdomen.  Objective:    Vitals:   05/17/21 1813 05/17/21 2117 05/18/21 0100 05/18/21 0625  BP:  133/77 107/66 109/73  Pulse:  69 61 (!) 57  Resp:  20 18 18   Temp:  98.5 F (36.9 C) 98.3 F (36.8 C) 98.5 F (36.9 C)  TempSrc:   Oral Oral  SpO2:  100% 100% 100%  Weight: 68 kg     Height: 5\' 8"  (1.727 m)      No data found.   Intake/Output Summary (Last 24 hours) at 05/18/2021 1025 Last data filed at 05/18/2021 0954 Gross per 24 hour  Intake 1268.71 ml  Output --  Net 1268.71 ml   Filed Weights   05/17/21 1813  Weight: 68 kg    Exam:  GEN: NAD SKIN: No rash EYES: EOMI ENT: MMM CV: RRR PULM: CTA B ABD: soft, ND, right upper quadrant tenderness, no rebound tenderness or guarding,, +BS CNS: AAO x 3, non  focal EXT: No edema or tenderness        Data Reviewed:   I have personally reviewed following labs and imaging studies:  Labs: Labs show the following:   Basic Metabolic Panel: Recent Labs  Lab 05/16/21 1108 05/17/21 1145 05/18/21 0603  NA 138 137 139  K 3.7 4.2 3.9  CL 102 103 108  CO2 28 27 24   GLUCOSE 78 97 94  BUN 10 11 8   CREATININE 0.78 0.79 0.75  CALCIUM 8.6* 9.0 8.4*   GFR Estimated Creatinine Clearance: 96.2 mL/min (by C-G formula based on SCr of 0.75 mg/dL). Liver Function Tests: Recent Labs  Lab 05/16/21 1108 05/17/21 1145 05/18/21 0603  AST 28 29 30   ALT 191* 160* 112*  ALKPHOS 66 70 55  BILITOT 0.4 0.4 0.7  PROT 6.6 7.7 6.1*  ALBUMIN 3.7 4.2 3.5   Recent Labs  Lab 05/16/21 1108  LIPASE 33   No results for input(s): AMMONIA in the last 168 hours. Coagulation profile No results for input(s): INR, PROTIME in the last 168 hours.  CBC: Recent Labs  Lab 05/16/21 1108 05/17/21 1145  05/18/21 0603  WBC 6.9 7.6 5.7  NEUTROABS  --  5.0  --   HGB 14.3 15.7* 13.9  HCT 42.9 46.8* 43.4  MCV 90.1 90.3 90.8  PLT 183 194 165   Cardiac Enzymes: No results for input(s): CKTOTAL, CKMB, CKMBINDEX, TROPONINI in the last 168 hours. BNP (last 3 results) No results for input(s): PROBNP in the last 8760 hours. CBG: No results for input(s): GLUCAP in the last 168 hours. D-Dimer: No results for input(s): DDIMER in the last 72 hours. Hgb A1c: No results for input(s): HGBA1C in the last 72 hours. Lipid Profile: No results for input(s): CHOL, HDL, LDLCALC, TRIG, CHOLHDL, LDLDIRECT in the last 72 hours. Thyroid function studies: No results for input(s): TSH, T4TOTAL, T3FREE, THYROIDAB in the last 72 hours.  Invalid input(s): FREET3 Anemia work up: No results for input(s): VITAMINB12, FOLATE, FERRITIN, TIBC, IRON, RETICCTPCT in the last 72 hours. Sepsis Labs: Recent Labs  Lab 05/16/21 1108 05/17/21 1145 05/18/21 0603  WBC 6.9 7.6 5.7    Microbiology Recent Results (from the past 240 hour(s))  Resp Panel by RT-PCR (Flu A&B, Covid) Nasopharyngeal Swab     Status: None   Collection Time: 05/17/21 11:59 AM   Specimen: Nasopharyngeal Swab; Nasopharyngeal(NP) swabs in vial transport medium  Result Value Ref Range Status   SARS Coronavirus 2 by RT PCR NEGATIVE NEGATIVE Final    Comment: (NOTE) SARS-CoV-2 target nucleic acids are NOT DETECTED.  The SARS-CoV-2 RNA is generally detectable in upper respiratory specimens during the acute phase of infection. The lowest concentration of SARS-CoV-2 viral copies this assay can detect is 138 copies/mL. A negative result does not preclude SARS-Cov-2 infection and should not be used as the sole basis for treatment or other patient management decisions. A negative result may occur with  improper specimen collection/handling, submission of specimen other than nasopharyngeal swab, presence of viral mutation(s) within the areas targeted by  this assay, and inadequate number of viral copies(<138 copies/mL). A negative result must be combined with clinical observations, patient history, and epidemiological information. The expected result is Negative.  Fact Sheet for Patients:  EntrepreneurPulse.com.au  Fact Sheet for Healthcare Providers:  IncredibleEmployment.be  This test is no t yet approved or cleared by the [place] FDA and  has been authorized for detection and/or diagnosis of SARS-CoV-2 by FDA under an Emergency Use Authorization (EUA).  This EUA will remain  in effect (meaning this test can be used) for the duration of the COVID-19 declaration under Section 564(b)(1) of the Act, 21 U.S.C.section 360bbb-3(b)(1), unless the authorization is terminated  or revoked sooner.       Influenza A by PCR NEGATIVE NEGATIVE Final   Influenza B by PCR NEGATIVE NEGATIVE Final    Comment: (NOTE) The Xpert Xpress SARS-CoV-2/FLU/RSV plus assay is intended as an aid in the diagnosis of influenza from Nasopharyngeal swab specimens and should not be used as a sole basis for treatment. Nasal washings and aspirates are unacceptable for Xpert Xpress SARS-CoV-2/FLU/RSV testing.  Fact Sheet for Patients: EntrepreneurPulse.com.au  Fact Sheet for Healthcare Providers: IncredibleEmployment.be  This test is not yet approved or cleared by the [place] FDA and has been authorized for detection and/or diagnosis of SARS-CoV-2 by FDA under an Emergency Use Authorization (EUA). This EUA will remain in effect (meaning this test can be used) for the duration of the COVID-19 declaration under Section 564(b)(1) of the Act, 21 U.S.C. section 360bbb-3(b)(1), unless the authorization is terminated or revoked.  Performed at [facility], [address] [place], [place] [address]     Procedures and diagnostic studies:  US Abdomen Limited RUQ/Gall  Gladder  Result Date: 05/17/2021 CLINICAL DATA:  Right upper quadrant pain for 2 weeks. EXAM: ULTRASOUND ABDOMEN LIMITED RIGHT UPPER QUADRANT COMPARISON:  None. FINDINGS: Gallbladder: Multiple shadowing stones are present. This is essentially a wall echo complex. The sonographer reports a sonographic Murphy sign. Stones measure up to 1.4 cm. Common bile duct: Diameter: 5 mm, within normal limits. Liver: The liver is somewhat echogenic. No discrete lesions are present. Portal vein is patent on color Doppler imaging with normal direction of blood flow towards the liver. Other: A right pleural effusion is noted. A node is identified in the porta hepatis measuring 1.4 x 0.9 x 1.7 cm. IMPRESSION: 1. Cholelithiasis with sonographic Murphy sign and minimal pericholecystic fluid suggesting acute cholecystitis. 2. Right pleural effusion. 3. Indeterminate node in the porta hepatis. Electronically Signed   By: San Morelle M.D.   On: 05/17/2021 10:13               LOS: 0 days   Quantina Dershem  [facility]   Pager on [email or web address]. If 7PM-7AM, please contact night-coverage at [email or web address]     05/18/2021, 10:25 AM

## 2021-05-18 NOTE — Progress Notes (Signed)
[facility]  Updated family at bedside. Can d/c home later if feeling ok, pain controlled, tolerates some po. Staples will be taken out 1/17.   If stays overnight will dc in the am.   Curlene Labrum, MD [facility] [address] [place], [place] [address] [phone] (office)

## 2021-05-18 NOTE — Anesthesia Procedure Notes (Signed)
Procedure Name: Intubation Date/Time: 05/18/2021 1:44 PM Performed by: Maude Leriche, CRNA Pre-anesthesia Checklist: Patient identified, Emergency Drugs available, Suction available and Patient being monitored Patient Re-evaluated:Patient Re-evaluated prior to induction Oxygen Delivery Method: Circle system utilized Preoxygenation: Pre-oxygenation with 100% oxygen Induction Type: IV induction Ventilation: Mask ventilation without difficulty Laryngoscope Size: Miller and 2 Grade View: Grade I Tube type: Oral Tube size: 7.0 mm Number of attempts: 1 Airway Equipment and Method: Stylet and Oral airway Placement Confirmation: ETT inserted through vocal cords under direct vision, positive ETCO2 and breath sounds checked- equal and bilateral Secured at: 21 cm Tube secured with: Tape Dental Injury: Teeth and Oropharynx as per pre-operative assessment  Comments: Rotten & missing teeth observed

## 2021-05-18 NOTE — Op Note (Signed)
Operative Note   Preoperative Diagnosis: Acute cholecystitis    Postoperative Diagnosis: Same   Procedure(s) Performed: Laparoscopic cholecystectomy   Surgeon: Ria Comment C. Constance Haw, MD   Assistants: Aviva Signs, MD   Anesthesia: General endotracheal   Anesthesiologist: Dr. Briant Cedar, MD    Specimens: Gallbladder    Estimated Blood Loss: Minimal    Blood Replacement: None    Complications: None    Operative Findings:  Acute cholecystitis with thickening and edema, stones    Procedure: The patient was taken to the operating room and placed supine. General endotracheal anesthesia was induced. Intravenous antibiotics were administered per protocol. An orogastric tube positioned to decompress the stomach. The abdomen was prepared and draped in the usual sterile fashion.    A supraumbilical incision was made and a Veress technique was utilized to achieve pneumoperitoneum to 15 mmHg with carbon dioxide. A 11 mm optiview port was placed through the supraumbilical region, and a 10 mm 0-degree operative laparoscope was introduced. The area underlying the trocar and Veress needle were inspected and without evidence of injury.  Remaining trocars were placed under direct vision. Two 5 mm ports were placed in the right abdomen, between the anterior axillary and midclavicular line.  A final 11 mm port was placed through the mid-epigastrium, near the falciform ligament.    The gallbladder fundus was elevated cephalad and the infundibulum was retracted to the patient's right. The gallbladder/cystic duct junction was skeletonized. The cystic artery noted in the triangle of Calot and was also skeletonized.  We then continued liberal medial and lateral dissection until the critical view of safety was achieved. There was a very large node of calot.    The cystic duct and cystic artery were triply clipped and divided. The gallbladder was then dissected from the liver bed with electrocautery. The specimen was  placed in an Endopouch and was retrieved through the epigastric site. The bag did break and there was some spillage of bile and stones, which were cleaned up intraperitoneal. No stones were left.    Final inspection revealed acceptable hemostasis. Surgical SNOW was placed in the gallbladder bed.  Trocars were removed and pneumoperitoneum was released.  0 Vicryl fascial sutures were used to close the epigastric and umbilical port sites Skin incisions were closed with staples and sterile dressing. The patient was awakened from anesthesia and extubated without complication.    Curlene Labrum, MD [facility] [address] [place], [place] [address] [phone] (office)

## 2021-05-18 NOTE — Consult Note (Signed)
[facility] Consult   HPI: Ann Hoover is a 41 y.o. female who presents with a 3-day history of right upper quadrant abdominal pain, nausea, and vomiting.  She has had similar episodes of this pain in the past, but they have never been this severe.  Her pain started on Monday after eating a bowl of chili.  Her pain has been a constant ache with occasional sharper episodes in her right upper quadrant and epigastrium.  She initially presented to the hospital on 1/2, but patient was unable to obtain an ultrasound or CT of the abdomen and pelvis at that time.  She was discharged home with scheduled outpatient ultrasound the next day.  Her ultrasound on 1/3 demonstrated cholelithiasis with minimal pericholecystic fluid, concerning for acute cholecystitis.  She has no leukocytosis, WBC 5.7.  Her ALT is slightly elevated at 112, but remainder of her LFTs are within normal limits.  She has no history of abdominal surgeries.  She smokes 1 pack/day and has done so for many years.  She denies illegal drug use and alcohol use.  She denies use of blood thinning medications.  Past Medical History:  Diagnosis Date   Chronic back pain    DVT (deep venous thrombosis) (HCC)    Herniated disc    Pulmonary embolus (HCC)     Past Surgical History:  Procedure Laterality Date   DILITATION & CURRETTAGE/HYSTROSCOPY WITH NOVASURE ABLATION N/A 06/07/2016   Procedure: DILATATION & CURETTAGE/HYSTEROSCOPY WITH NOVASURE ABLATION;  Surgeon: Florian Buff, MD;  Location: [facility];  Service: Gynecology;  Laterality: N/A;   TUBAL LIGATION      Family History  Problem Relation Age of Onset   Hyperlipidemia Mother    Diabetes Father    Cancer Father        Hodkins   COPD Father    Cirrhosis Father    Pancreatic cancer Maternal Grandmother    Colon cancer Paternal Grandmother     Social History   Tobacco Use   Smoking status: Every Day    Packs/day: 1.50    Years: 12.00    Pack years: 18.00     Types: Cigarettes   Smokeless tobacco: Never  Substance Use Topics   Alcohol use: Yes    Comment: occasional   Drug use: No    Medications: I have reviewed the patient's current medications.  No Known Allergies   ROS:  Constitutional: negative for chills, fevers, malaise, and night sweats Respiratory: negative for cough, dyspnea on exertion, and wheezing Cardiovascular: negative for chest pain, dyspnea, fatigue, and tachypnea Gastrointestinal: positive for abdominal pain and nausea, negative for vomiting Musculoskeletal:negative for back pain and muscle weakness Behavioral/Psych: negative for anxiety and depression  Blood pressure 109/73, pulse (!) 57, temperature 98.5 F (36.9 C), temperature source Oral, resp. rate 18, height 5\' 8"  (1.727 m), weight 68 kg, SpO2 100 %, unknown if currently breastfeeding. Physical Exam Vitals reviewed.  Constitutional:      Appearance: Normal appearance.  HENT:     Head: Normocephalic and atraumatic.  Eyes:     Pupils: Pupils are equal, round, and reactive to light.  Cardiovascular:     Rate and Rhythm: Normal rate and regular rhythm.  Pulmonary:     Effort: Pulmonary effort is normal.     Breath sounds: Normal breath sounds.  Abdominal:     Comments: Abdomen soft, no percussion tenderness, tenderness to palpation in right upper quadrant, negative Murphy's, no rigidity, guarding, rebound tenderness  Musculoskeletal:  General: Normal range of motion.  Skin:    General: Skin is warm and dry.  Neurological:     General: No focal deficit present.     Mental Status: She is alert and oriented to person, place, and time.  Psychiatric:        Mood and Affect: Mood normal.        Behavior: Behavior normal.    Results: Results for orders placed or performed during the hospital encounter of 05/17/21 (from the past 48 hour(s))  Comprehensive metabolic panel     Status: Abnormal   Collection Time: 05/17/21 11:45 AM  Result Value Ref  Range   Sodium 137 135 - 145 mmol/L   Potassium 4.2 3.5 - 5.1 mmol/L   Chloride 103 98 - 111 mmol/L   CO2 27 22 - 32 mmol/L   Glucose, Bld 97 70 - 99 mg/dL    Comment: Glucose reference range applies only to samples taken after fasting for at least 8 hours.   BUN 11 6 - 20 mg/dL   Creatinine, Ser 0.79 0.44 - 1.00 mg/dL   Calcium 9.0 8.9 - 10.3 mg/dL   Total Protein 7.7 6.5 - 8.1 g/dL   Albumin 4.2 3.5 - 5.0 g/dL   AST 29 15 - 41 U/L   ALT 160 (H) 0 - 44 U/L   Alkaline Phosphatase 70 38 - 126 U/L   Total Bilirubin 0.4 0.3 - 1.2 mg/dL   GFR, Estimated >60 >60 mL/min    Comment: (NOTE) Calculated using the CKD-EPI Creatinine Equation (2021)    Anion gap 7 5 - 15    Comment: Performed at [facility], [address], [place], [place] [address]  CBC with Differential     Status: Abnormal   Collection Time: 05/17/21 11:45 AM  Result Value Ref Range   WBC 7.6 4.0 - 10.5 K/uL   RBC 5.18 (H) 3.87 - 5.11 MIL/uL   Hemoglobin 15.7 (H) 12.0 - 15.0 g/dL   HCT 46.8 (H) 36.0 - 46.0 %   MCV 90.3 80.0 - 100.0 fL   MCH 30.3 26.0 - 34.0 pg   MCHC 33.5 30.0 - 36.0 g/dL   RDW 12.3 11.5 - 15.5 %   Platelets 194 150 - 400 K/uL   nRBC 0.0 0.0 - 0.2 %   Neutrophils Relative % 67 %   Neutro Abs 5.0 1.7 - 7.7 K/uL   Lymphocytes Relative 26 %   Lymphs Abs 2.0 0.7 - 4.0 K/uL   Monocytes Relative 4 %   Monocytes Absolute 0.3 0.1 - 1.0 K/uL   Eosinophils Relative 2 %   Eosinophils Absolute 0.1 0.0 - 0.5 K/uL   Basophils Relative 1 %   Basophils Absolute 0.1 0.0 - 0.1 K/uL   Immature Granulocytes 0 %   Abs Immature Granulocytes 0.02 0.00 - 0.07 K/uL    Comment: Performed at [facility], [address] [place], [place] [address]  Resp Panel by RT-PCR (Flu A&B, Covid) Nasopharyngeal Swab     Status: None   Collection Time: 05/17/21 11:59 AM   Specimen: Nasopharyngeal Swab; Nasopharyngeal(NP) swabs in vial transport medium  Result Value Ref Range   SARS Coronavirus 2 by RT PCR NEGATIVE NEGATIVE     Comment: (NOTE) SARS-CoV-2 target nucleic acids are NOT DETECTED.  The SARS-CoV-2 RNA is generally detectable in upper respiratory specimens during the acute phase of infection. The lowest concentration of SARS-CoV-2 viral copies this assay can detect is 138 copies/mL. A negative result does  not preclude SARS-Cov-2 infection and should not be used as the sole basis for treatment or other patient management decisions. A negative result may occur with  improper specimen collection/handling, submission of specimen other than nasopharyngeal swab, presence of viral mutation(s) within the areas targeted by this assay, and inadequate number of viral copies(<138 copies/mL). A negative result must be combined with clinical observations, patient history, and epidemiological information. The expected result is Negative.  Fact Sheet for Patients:  EntrepreneurPulse.com.au  Fact Sheet for Healthcare Providers:  IncredibleEmployment.be  This test is no t yet approved or cleared by the [place] FDA and  has been authorized for detection and/or diagnosis of SARS-CoV-2 by FDA under an Emergency Use Authorization (EUA). This EUA will remain  in effect (meaning this test can be used) for the duration of the COVID-19 declaration under Section 564(b)(1) of the Act, 21 U.S.C.section 360bbb-3(b)(1), unless the authorization is terminated  or revoked sooner.       Influenza A by PCR NEGATIVE NEGATIVE   Influenza B by PCR NEGATIVE NEGATIVE    Comment: (NOTE) The Xpert Xpress SARS-CoV-2/FLU/RSV plus assay is intended as an aid in the diagnosis of influenza from Nasopharyngeal swab specimens and should not be used as a sole basis for treatment. Nasal washings and aspirates are unacceptable for Xpert Xpress SARS-CoV-2/FLU/RSV testing.  Fact Sheet for Patients: EntrepreneurPulse.com.au  Fact Sheet for Healthcare  Providers: IncredibleEmployment.be  This test is not yet approved or cleared by the [place] FDA and has been authorized for detection and/or diagnosis of SARS-CoV-2 by FDA under an Emergency Use Authorization (EUA). This EUA will remain in effect (meaning this test can be used) for the duration of the COVID-19 declaration under Section 564(b)(1) of the Act, 21 U.S.C. section 360bbb-3(b)(1), unless the authorization is terminated or revoked.  Performed at [facility], [address], [place], [place] [address]   Comprehensive metabolic panel     Status: Abnormal   Collection Time: 05/18/21  6:03 AM  Result Value Ref Range   Sodium 139 135 - 145 mmol/L   Potassium 3.9 3.5 - 5.1 mmol/L   Chloride 108 98 - 111 mmol/L   CO2 24 22 - 32 mmol/L   Glucose, Bld 94 70 - 99 mg/dL    Comment: Glucose reference range applies only to samples taken after fasting for at least 8 hours.   BUN 8 6 - 20 mg/dL   Creatinine, Ser 0.75 0.44 - 1.00 mg/dL   Calcium 8.4 (L) 8.9 - 10.3 mg/dL   Total Protein 6.1 (L) 6.5 - 8.1 g/dL   Albumin 3.5 3.5 - 5.0 g/dL   AST 30 15 - 41 U/L   ALT 112 (H) 0 - 44 U/L   Alkaline Phosphatase 55 38 - 126 U/L   Total Bilirubin 0.7 0.3 - 1.2 mg/dL   GFR, Estimated >60 >60 mL/min    Comment: (NOTE) Calculated using the CKD-EPI Creatinine Equation (2021)    Anion gap 7 5 - 15    Comment: Performed at [facility], [address] [place], [place] [address]  CBC     Status: None   Collection Time: 05/18/21  6:03 AM  Result Value Ref Range   WBC 5.7 4.0 - 10.5 K/uL   RBC 4.78 3.87 - 5.11 MIL/uL   Hemoglobin 13.9 12.0 - 15.0 g/dL   HCT 43.4 36.0 - 46.0 %   MCV 90.8 80.0 - 100.0 fL   MCH 29.1 26.0 - 34.0 pg   MCHC  32.0 30.0 - 36.0 g/dL   RDW 12.2 11.5 - 15.5 %   Platelets 165 150 - 400 K/uL   nRBC 0.0 0.0 - 0.2 %    Comment: Performed at [facility], [address] [place], [place] [address]    US Abdomen Limited RUQ/Gall Gladder  Result  Date: 05/17/2021 CLINICAL DATA:  Right upper quadrant pain for 2 weeks. EXAM: ULTRASOUND ABDOMEN LIMITED RIGHT UPPER QUADRANT COMPARISON:  None. FINDINGS: Gallbladder: Multiple shadowing stones are present. This is essentially a wall echo complex. The sonographer reports a sonographic Murphy sign. Stones measure up to 1.4 cm. Common bile duct: Diameter: 5 mm, within normal limits. Liver: The liver is somewhat echogenic. No discrete lesions are present. Portal vein is patent on color Doppler imaging with normal direction of blood flow towards the liver. Other: A right pleural effusion is noted. A node is identified in the porta hepatis measuring 1.4 x 0.9 x 1.7 cm. IMPRESSION: 1. Cholelithiasis with sonographic Murphy sign and minimal pericholecystic fluid suggesting acute cholecystitis. 2. Right pleural effusion. 3. Indeterminate node in the porta hepatis. Electronically Signed   By: San Morelle M.D.   On: 05/17/2021 10:13     Assessment & Plan:  Ann Hoover is a 41 y.o. female with a 3-day history of right upper quadrant abdominal pain, nausea, vomiting.  Abdominal ultrasound with cholelithiasis and minimal pericholecystic fluid, concerning for acute cholecystitis.  No leukocytosis.  Minimal elevation in ALT, which is improving since admission.  -Plan for laparoscopic cholecystectomy this afternoon -NPO -IVF -Rocephin/Flagyl -Pain medications and antiemetics PRN -I counseled the patient about the indication, risks and benefits of laparoscopic cholecystectomy.  She understands there is a very small chance for bleeding, infection, injury to normal structures (including common bile duct), conversion to open surgery, persistent symptoms, evolution of postcholecystectomy diarrhea, need for secondary interventions, anesthesia reaction, cardiopulmonary issues and other risks not specifically detailed here. I described the expected recovery, the plan for follow-up and the restrictions during the  recovery phase.  All questions were answered.  She is agreeable to surgery today -Possible discharge home later today versus tomorrow morning depending on how patient does postoperatively  -- Graciella Freer, DO [facility] [address] [place], [place] [address] [phone] (office)

## 2021-05-18 NOTE — Progress Notes (Signed)
[facility]  Acute cholecystitis versus biliary colic. Lap chole today.   Soft, tender RUQ  PLAN: I counseled the patient about the indication, risks and benefits of laparoscopic cholecystectomy.  She understands there is a very small chance for bleeding, infection, injury to normal structures (including common bile duct), conversion to open surgery, persistent symptoms, evolution of postcholecystectomy diarrhea, need for secondary interventions, anesthesia reaction, cardiopulmonary issues and other risks not specifically detailed here. I described the expected recovery, the plan for follow-up and the restrictions during the recovery phase.  All questions were answered.  Curlene Labrum, MD [facility] [address] [place], [place] [address] [phone] (office)

## 2021-05-18 NOTE — Transfer of Care (Signed)
Immediate Anesthesia Transfer of Care Note  Patient: Ann Hoover  Procedure(s) Performed: LAPAROSCOPIC CHOLECYSTECTOMY, POSSIBLE OPEN (Abdomen)  Patient Location: PACU  Anesthesia Type:General  Level of Consciousness: awake, alert  and oriented  Airway & Oxygen Therapy: Patient Spontanous Breathing and Patient connected to nasal cannula oxygen  Post-op Assessment: Report given to RN, Post -op Vital signs reviewed and stable, Patient moving all extremities X 4 and Patient able to stick tongue midline  Post vital signs: Reviewed  Last Vitals:  Vitals Value Taken Time  BP 132/81   Temp 98.3   Pulse 82   Resp 12   SpO2 100     Last Pain:  Vitals:   05/18/21 1234  TempSrc: Oral  PainSc: 0-No pain      Patients Stated Pain Goal: 3 (75/88/32 5498)  Complications: No notable events documented.

## 2021-05-18 NOTE — Discharge Instructions (Signed)
Discharge Laparoscopic Surgery Instructions:  Common Complaints: Right shoulder pain is common after laparoscopic surgery. This is secondary to the gas used in the surgery being trapped under the diaphragm.  Walk to help your body absorb the gas. This will improve in a few days. Pain at the port sites are common, especially the larger port sites. This will improve with time.  Some nausea is common and poor appetite. The main goal is to stay hydrated the first few days after surgery.   Diet/ Activity: Diet as tolerated. You may not have an appetite, but it is important to stay hydrated. Drink 64 ounces of water a day. Your appetite will return with time.  Do not pick at your incisions. You can remove the bandages on 05/20/2021. Replace as needed. You may shower.  Shower per your regular routine daily.   Rest and listen to your body, but do not remain in bed all day.  Walk everyday for at least 15-20 minutes. Deep cough and move around every 1-2 hours in the first few days after surgery.  Do not lift > 10 lbs, perform excessive bending, pushing, pulling, squatting for 1-2 weeks after surgery.  Do not place lotions or balms on your incision unless instructed to specifically by Dr. Constance Haw.   Pain Expectations and Narcotics: -After surgery you will have pain associated with your incisions and this is normal. The pain is muscular and nerve pain, and will get better with time. -You are encouraged and expected to take non narcotic medications like tylenol and ibuprofen (when able) to treat pain as multiple modalities can aid with pain treatment. -Narcotics are only used when pain is severe or there is breakthrough pain. -You are not expected to have a pain score of 0 after surgery, as we cannot prevent pain. A pain score of 3-4 that allows you to be functional, move, walk, and tolerate some activity is the goal. The pain will continue to improve over the days after surgery and is dependent on your  surgery. -Due to [place] law, we are only able to give a certain amount of pain medication to treat post operative pain, and we only give additional narcotics on a patient by patient basis.  -For most laparoscopic surgery, studies have shown that the majority of patients only need 10-15 narcotic pills, and for open surgeries most patients only need 15-20.   -Having appropriate expectations of pain and knowledge of pain management with non narcotics is important as we do not want anyone to become addicted to narcotic pain medication.  -Using ice packs in the first 48 hours and heating pads after 48 hours, wearing an abdominal binder (when recommended), and using over the counter medications are all ways to help with pain management.   -Simple acts like meditation and mindfulness practices after surgery can also help with pain control and research has proven the benefit of these practices.  Medication: Take tylenol and ibuprofen as needed for pain control, alternating every 4-6 hours.  Example:  Tylenol 1000mg  @ 6am, 12noon, 6pm, 13midnight (Do not exceed 4000mg  of tylenol a day). Ibuprofen 800mg  @ 9am, 3pm, 9pm, 3am (Do not exceed 3600mg  of ibuprofen a day).  Take Roxicodone for breakthrough pain every 4 hours.  Take Colace for constipation related to narcotic pain medication. If you do not have a bowel movement in 2 days, take Miralax over the counter.  Drink plenty of water to also prevent constipation.   Contact Information: If you have questions or concerns,  please call our office, [phone], Monday- Thursday 8AM-5PM and Friday 8AM-12Noon.  If it is after hours or on the weekend, please call [facility] Main Number, [phone], [phone], and ask to speak to the surgeon on call for Dr. Constance Haw at [facility].

## 2021-05-18 NOTE — Anesthesia Preprocedure Evaluation (Addendum)
Anesthesia Evaluation  Patient identified by MRN, date of birth, ID band Patient awake    Reviewed: Allergy & Precautions, H&P , NPO status , Patient's Chart, lab work & pertinent test results, reviewed documented beta blocker date and time   Airway Mallampati: I  TM Distance: >3 FB Neck ROM: full    Dental  (+) Poor Dentition, Missing, Dental Advisory Given,    Pulmonary neg pulmonary ROS, Current Smoker and Patient abstained from smoking.,    Pulmonary exam normal breath sounds clear to auscultation       Cardiovascular Exercise Tolerance: Good negative cardio ROS   Rhythm:regular Rate:Normal     Neuro/Psych negative neurological ROS  negative psych ROS   GI/Hepatic negative GI ROS, Neg liver ROS,   Endo/Other  negative endocrine ROS  Renal/GU negative Renal ROS  negative genitourinary   Musculoskeletal negative musculoskeletal ROS (+)   Abdominal   Peds negative pediatric ROS (+)  Hematology negative hematology ROS (+)   Anesthesia Other Findings H/o DVT-PE  Reproductive/Obstetrics negative OB ROS                           Anesthesia Physical Anesthesia Plan  ASA: 2  Anesthesia Plan: General and General ETT   Post-op Pain Management:    Induction:   PONV Risk Score and Plan: Ondansetron  Airway Management Planned:   Additional Equipment:   Intra-op Plan:   Post-operative Plan:   Informed Consent: I have reviewed the patients History and Physical, chart, labs and discussed the procedure including the risks, benefits and alternatives for the proposed anesthesia with the patient or authorized representative who has indicated his/her understanding and acceptance.     Dental Advisory Given  Plan Discussed with: CRNA  Anesthesia Plan Comments:        Anesthesia Quick Evaluation

## 2021-05-18 NOTE — Discharge Summary (Signed)
Physician Discharge Summary  Patient ID: Ann Hoover MRN: [id number] DOB/AGE: 01-20-1981 41 y.o.  Admit date: 05/17/2021 Discharge date: 05/19/2021  Admission Diagnoses: Acute cholecystitis versus symptomatic cholelithiasis   Discharge Diagnoses:  Principal Problem:   Acute cholecystitis Active Problems:   Symptomatic cholelithiasis   Discharged Condition: good  Hospital Course: Ms. Ann Hoover is a 41 yo who comes in with abdominal pain and the CT scanner was not available and the [place] was not available. She was sent home by the ED with plans to return for [place] the next day. This revealed concerns for cholecystitis. My partner, Dr. Graciella Freer, DO saw her and discussed laparoscopic cholecystectomy given her continued pain, inability to take po and she was admitted by the hospitalist with plans for surgery the next day. I took her back and she underwent a laparoscopic cholecystectomy and did well. She did have findings of acute cholecystitis but no purulence. She stayed in the hospital again overnight due to pain control issues as she was having pain at a 8-10. The day of discharge she as feeling better, was tolerating a diet, was ambulating, and had received some flexeril to see if that would help her stabbing pain she had with movement at the 30mm lateral site.   Consults: Hospitalist admission, taken over by surgery post operatively   Significant Diagnostic Studies: [place]- acute cholecystitis   Treatments: IV hydration, antibiotics: ceftriaxone and metronidazole, and laparoscopic cholecystectomy 05/18/2021  Discharge Exam: Blood pressure 115/67, pulse 89, temperature 99.8 F (37.7 C), temperature source Oral, resp. rate 18, height 5\' 8"  (1.727 m), weight 68 kg, SpO2 100 %, unknown if currently breastfeeding. General appearance: alert and no distress Resp: normal work of breathing GI: soft, appropriately tender around port sites, no bruising, no erythema or drainage, no rebound or  guarding  Disposition: Discharge disposition: 01-Home or Self Care       Discharge Instructions     Call MD for:  difficulty breathing, headache or visual disturbances   Complete by: As directed    Call MD for:  extreme fatigue   Complete by: As directed    Call MD for:  persistant dizziness or light-headedness   Complete by: As directed    Call MD for:  persistant nausea and vomiting   Complete by: As directed    Call MD for:  redness, tenderness, or signs of infection (pain, swelling, redness, odor or green/yellow discharge around incision site)   Complete by: As directed    Call MD for:  severe uncontrolled pain   Complete by: As directed    Call MD for:  temperature >100.4   Complete by: As directed    Diet general   Complete by: As directed    Discharge wound care:   Complete by: As directed    Follow up with surgeon as scheduled   Increase activity slowly   Complete by: As directed    Increase activity slowly   Complete by: As directed       Allergies as of 05/19/2021   No Known Allergies      Medication List     STOP taking these medications    acetaminophen 325 MG tablet Commonly known as: TYLENOL   docusate sodium 100 MG capsule Commonly known as: COLACE   enoxaparin 40 MG/0.4ML injection Commonly known as: LOVENOX   HYDROcodone-acetaminophen 5-325 MG tablet Commonly known as: NORCO/VICODIN   HYDROmorphone 2 MG tablet Commonly known as: DILAUDID   ketorolac 10 MG tablet Commonly known  as: TORADOL   methocarbamol 750 MG tablet Commonly known as: ROBAXIN   ondansetron 4 MG disintegrating tablet Commonly known as: ZOFRAN-ODT       TAKE these medications    cyclobenzaprine 5 MG tablet Commonly known as: FLEXERIL Take 1 tablet (5 mg total) by mouth 3 (three) times daily as needed for muscle spasms.   Maalox Multi Symptom Max St 680-321-22 MG/5ML suspension Generic drug: alum & mag hydroxide-simeth Take 15 mLs by mouth every 6 (six)  hours as needed for indigestion.   ondansetron 4 MG tablet Commonly known as: ZOFRAN Take 1 tablet (4 mg total) by mouth every 6 (six) hours as needed for nausea.   oxyCODONE 5 MG immediate release tablet Commonly known as: Oxy IR/ROXICODONE Take 1 tablet (5 mg total) by mouth every 4 (four) hours as needed for severe pain or breakthrough pain.   pantoprazole 20 MG tablet Commonly known as: PROTONIX Take 1 tablet (20 mg total) by mouth daily.               Discharge Care Instructions  (From admission, onward)           Start     Ordered   05/19/21 0000  Discharge wound care:       Comments: Follow up with surgeon as scheduled   05/19/21 0940            Follow-up Information     Virl Cagey, MD Follow up on 05/31/2021.   Specialty: General Surgery Why: staple removal cholecystectomy Contact information: [address] [place] [facility] [address] [phone]                 Signed: Virl Cagey 05/19/2021, 2:47 PM

## 2021-05-18 NOTE — TOC Progression Note (Signed)
Transition of Care [facility]) - Progression Note    Patient Details  Name: Ann Hoover MRN: [id number] Date of Birth: Jun 20, 1980  Transition of Care [facility]) CM/SW Contact  Salome Arnt, [place] Phone Number: 05/18/2021, 11:04 AM  Clinical Narrative:   Transition of Care (TOC) Screening Note   Patient Details  Name: Ann Hoover Date of Birth: 1980/11/09   Transition of Care [facility]) CM/SW Contact:    Salome Arnt, [place] Phone Number: 05/18/2021, 11:04 AM    Transition of Care Department [facility]) has reviewed patient and no TOC needs have been identified at this time. We will continue to monitor patient advancement through interdisciplinary progression rounds. If new patient transition needs arise, please place a TOC consult.         Barriers to Discharge: Continued Medical Work up  Expected Discharge Plan and Services                                                 Social Determinants of Health (SDOH) Interventions    Readmission Risk Interventions No flowsheet data found.

## 2021-05-19 ENCOUNTER — Encounter (HOSPITAL_COMMUNITY): Payer: Self-pay | Admitting: Surgery

## 2021-05-19 MED ORDER — CYCLOBENZAPRINE HCL 5 MG PO TABS: 5.0000 mg | ORAL_TABLET | Freq: Three times a day (TID) | ORAL | 0 refills | Status: DC | PRN

## 2021-05-19 MED ORDER — CYCLOBENZAPRINE HCL 10 MG PO TABS
5.0000 mg | ORAL_TABLET | Freq: Three times a day (TID) | ORAL | Status: DC | PRN
  Administered 2021-05-19: 5 mg via ORAL
  Filled 2021-05-19: qty 1

## 2021-05-19 MED ORDER — MORPHINE SULFATE (PF) 4 MG/ML IV SOLN
4.0000 mg | Freq: Once | INTRAVENOUS | Status: AC
  Administered 2021-05-19: 4 mg via INTRAVENOUS
  Filled 2021-05-19: qty 1

## 2021-05-19 MED ORDER — KETOROLAC TROMETHAMINE 30 MG/ML IJ SOLN
30.0000 mg | Freq: Once | INTRAMUSCULAR | Status: AC
  Administered 2021-05-19: 30 mg via INTRAVENOUS
  Filled 2021-05-19: qty 1

## 2021-05-19 MED ORDER — HYDROMORPHONE HCL 1 MG/ML IJ SOLN
1.0000 mg | INTRAMUSCULAR | Status: DC | PRN
  Administered 2021-05-19 (×2): 1 mg via INTRAVENOUS
  Filled 2021-05-19 (×2): qty 1

## 2021-05-19 MED ORDER — OXYCODONE HCL 5 MG PO TABS
5.0000 mg | ORAL_TABLET | ORAL | Status: DC | PRN
  Administered 2021-05-19: 10 mg via ORAL
  Filled 2021-05-19: qty 2

## 2021-05-19 NOTE — Progress Notes (Signed)
Case discussed with Dr. Constance Haw, general surgeon.  She has taken over the care of this patient.  I will sign off at this time.  Please do not hesitate to call the hospitalist team for further assistance as needed.

## 2021-05-19 NOTE — Anesthesia Postprocedure Evaluation (Signed)
Anesthesia Post Note  Patient: Ann Hoover  Procedure(s) Performed: LAPAROSCOPIC CHOLECYSTECTOMY (Abdomen)  Patient location during evaluation: Phase II Anesthesia Type: General Level of consciousness: awake Pain management: pain level controlled Vital Signs Assessment: post-procedure vital signs reviewed and stable Respiratory status: spontaneous breathing and respiratory function stable Cardiovascular status: blood pressure returned to baseline and stable Postop Assessment: no headache and no apparent nausea or vomiting Anesthetic complications: no Comments: Late entry   No notable events documented.   Last Vitals:  Vitals:   05/18/21 2058 05/19/21 0536  BP: 128/74 131/75  Pulse: 72 66  Resp: 20 20  Temp: 36.8 C 37.8 C  SpO2: 98% 99%    Last Pain:  Vitals:   05/19/21 0628  TempSrc:   PainSc: [place]

## 2021-05-20 ENCOUNTER — Encounter (HOSPITAL_COMMUNITY): Payer: Self-pay | Admitting: Surgery

## 2021-05-20 LAB — SURGICAL PATHOLOGY

## 2021-05-31 ENCOUNTER — Encounter: Payer: Self-pay | Admitting: General Surgery

## 2021-05-31 ENCOUNTER — Other Ambulatory Visit: Payer: Self-pay

## 2021-05-31 ENCOUNTER — Ambulatory Visit (INDEPENDENT_AMBULATORY_CARE_PROVIDER_SITE_OTHER): Payer: Medicaid Other | Admitting: General Surgery

## 2021-05-31 VITALS — BP 127/85 | HR 114 | Temp 97.6°F | Resp 16 | Ht 68.0 in | Wt 142.0 lb

## 2021-05-31 DIAGNOSIS — K8020 Calculus of gallbladder without cholecystitis without obstruction: Secondary | ICD-10-CM

## 2021-05-31 MED ORDER — OXYCODONE HCL 5 MG PO TABS: 5.0000 mg | ORAL_TABLET | ORAL | 0 refills | Status: AC | PRN

## 2021-05-31 NOTE — Progress Notes (Signed)
[facility]  Doing ok but still some soreness esp with moving on her side.  BP 127/85    Pulse (!) 114    Temp 97.6 F (36.4 C) (Other (Comment))    Resp 16    Ht 5\' 8"  (1.727 m)    Wt 142 lb (64.4 kg)    SpO2 98%    BMI 21.59 kg/m  Staples c/d/I with no erythema or drainage, removed and steri strips placed  Pathology: FINAL MICROSCOPIC DIAGNOSIS:   A. GALLBLADDER, CHOLECYSTECTOMY:  - Chronic cholecystitis with cholelithiasis.  - Negative for dysplasia and malignancy.   Patient s/p laparoscopic cholecystectomy for cholecystitis. Doing better.  Heating pad as needed. Diet and activity as tolerated. Follow up as needed.  One time refill Roxicodone 5 mg q 4 PRN # [place], MD [facility] [address] [place], [place] [address] [phone] (office)

## 2021-05-31 NOTE — Patient Instructions (Addendum)
Heating pad as needed. Diet and activity as tolerated. Follow up as needed.

## 2021-06-02 ENCOUNTER — Encounter: Payer: Medicaid Other | Admitting: General Surgery

## 2022-06-13 IMAGING — US US ABDOMEN LIMITED
1 series · 14 of 25 positions shown · non-contrast
Comparison: None.

CLINICAL DATA: Right upper quadrant pain for 2 weeks.

EXAM:
ULTRASOUND ABDOMEN LIMITED RIGHT UPPER QUADRANT

[Series 1: us abdomen limited ruq (liver/gb) · 14 of 49 slices shown]
[im 1/49]
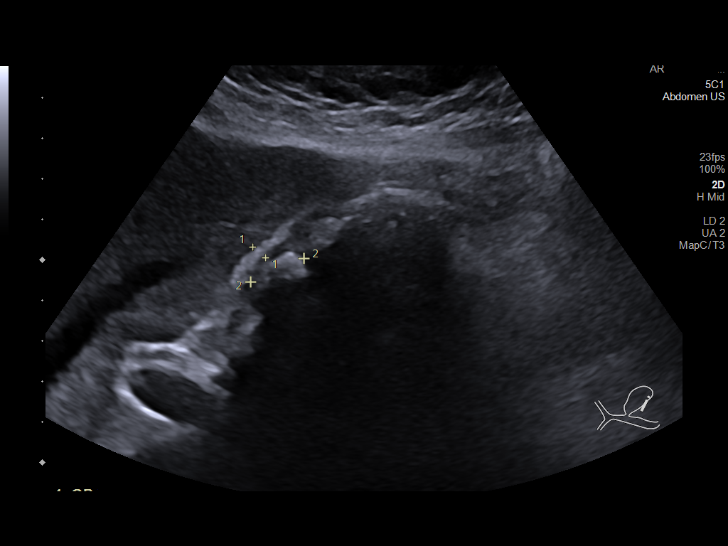
[im 5/49]
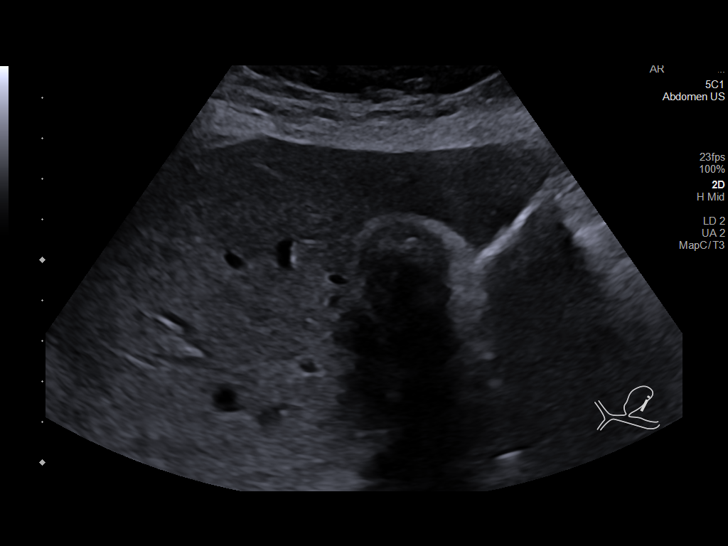
[im 9/49]
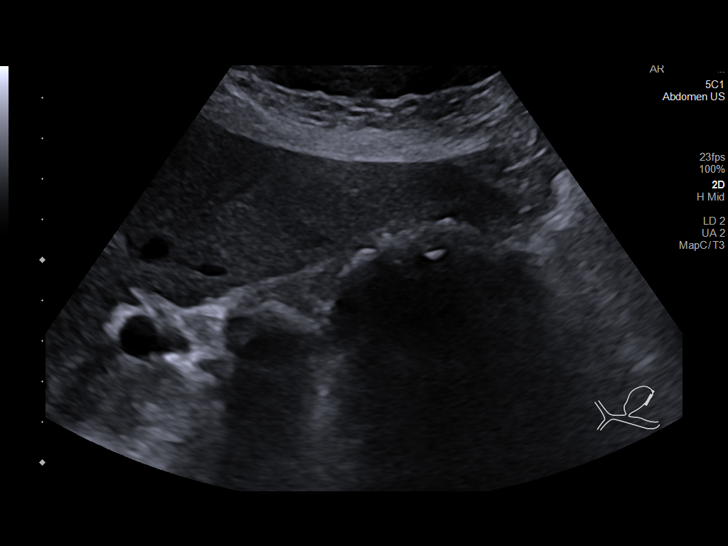
[im 13/49]
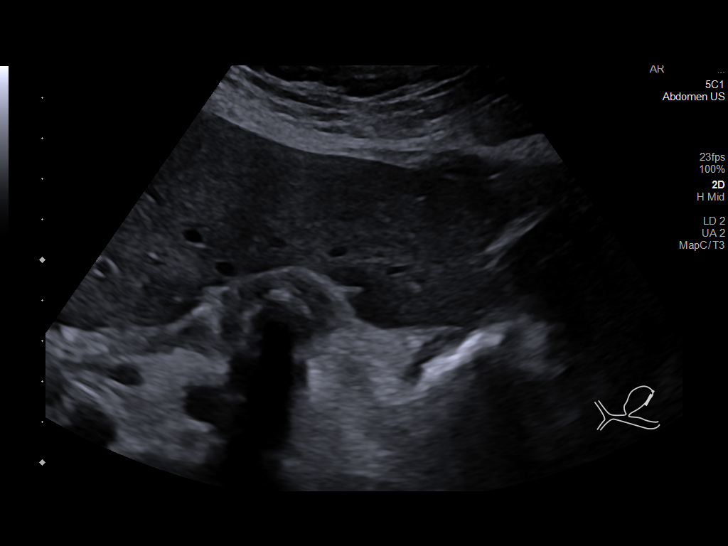
[im 17/49]
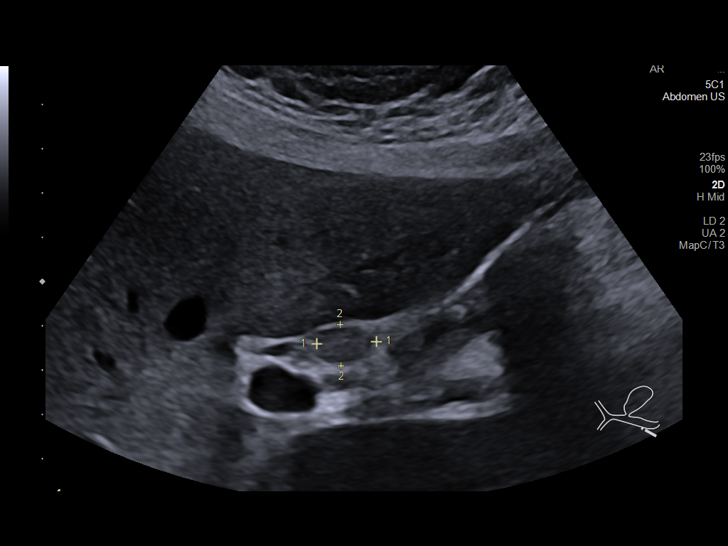
[im 19/49]
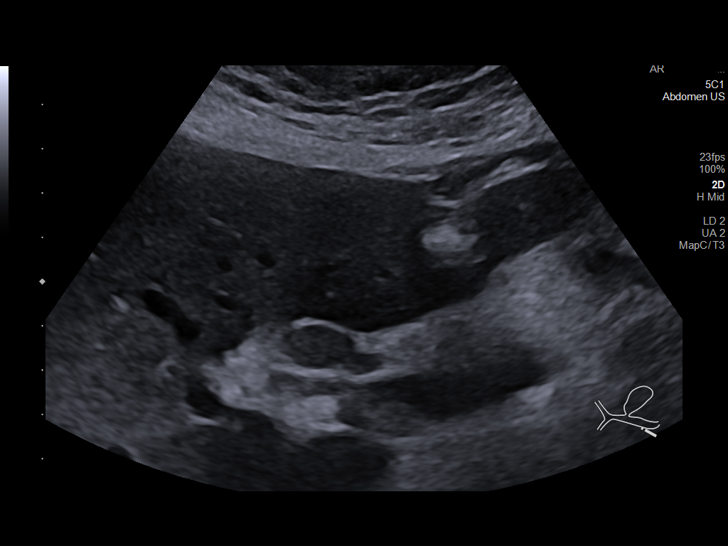
[im 23/49]
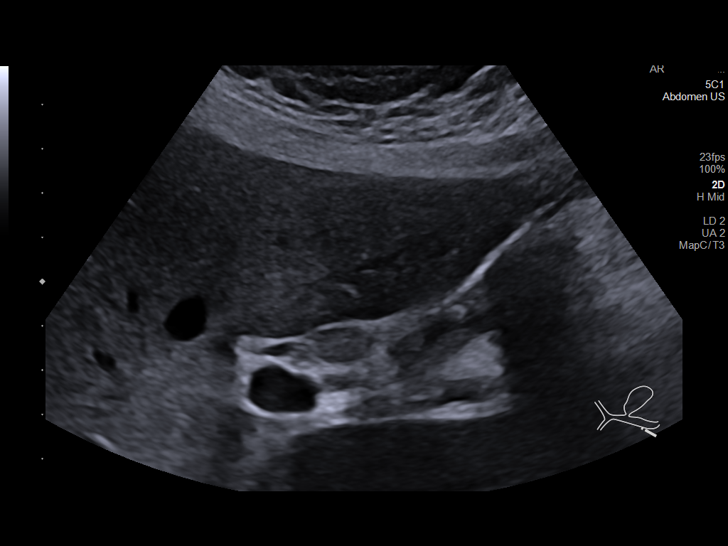
[im 27/49]
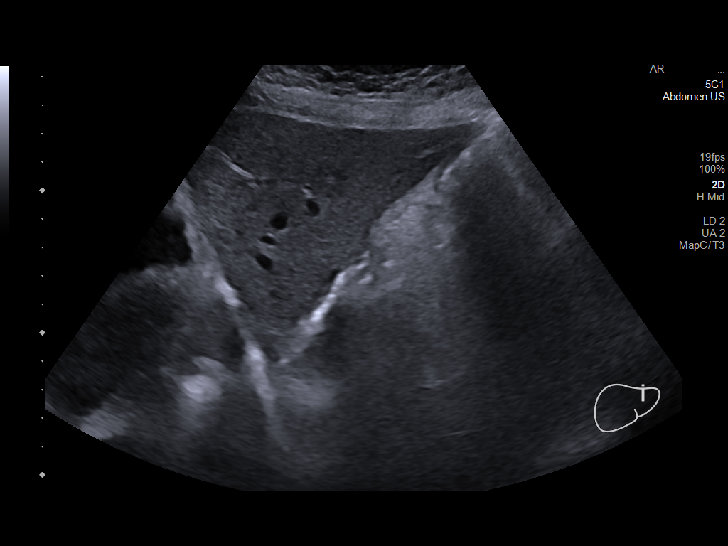
[im 31/49]
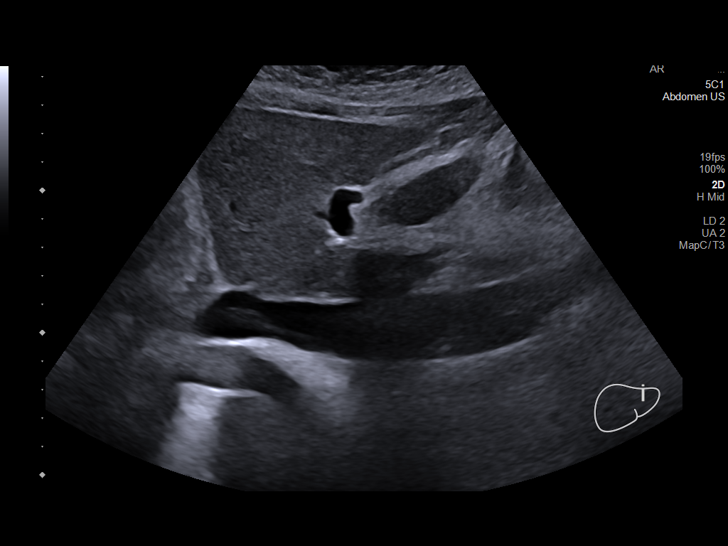
[im 33/49]
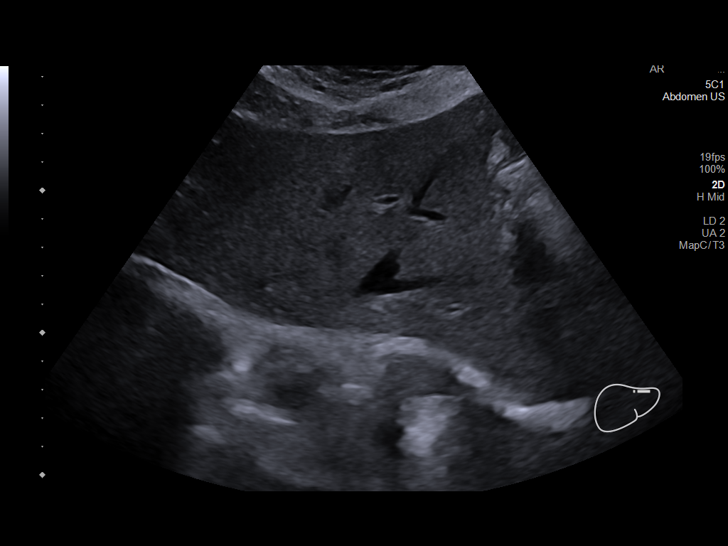
[im 37/49]
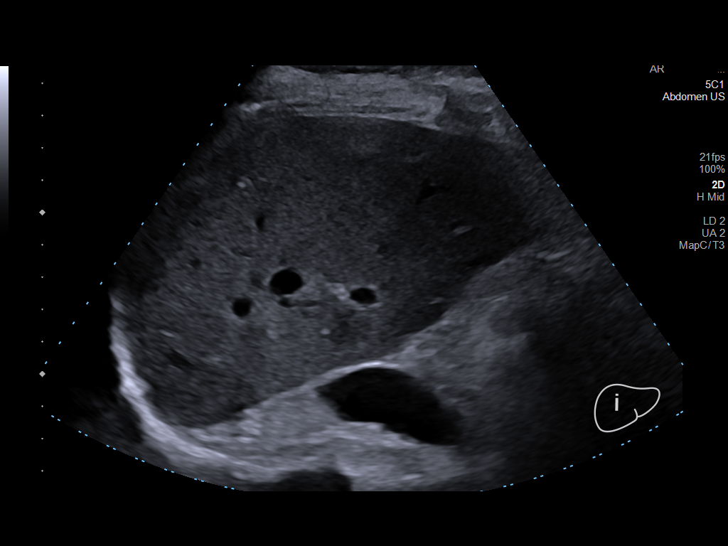
[im 41/49]
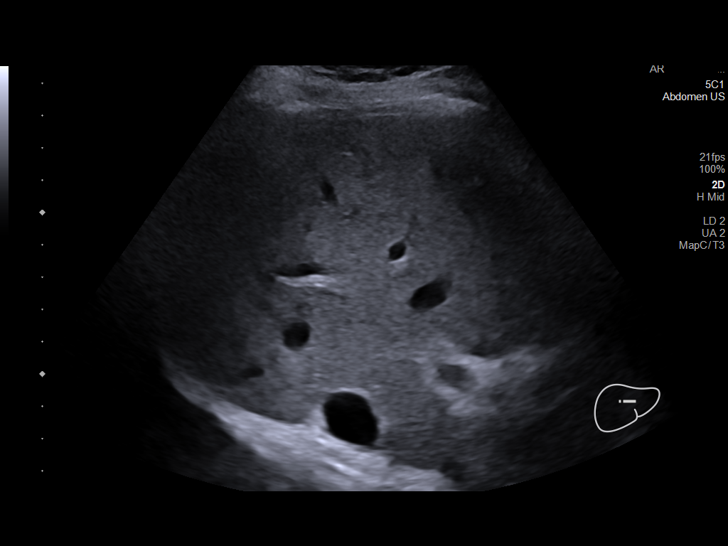
[im 45/49]
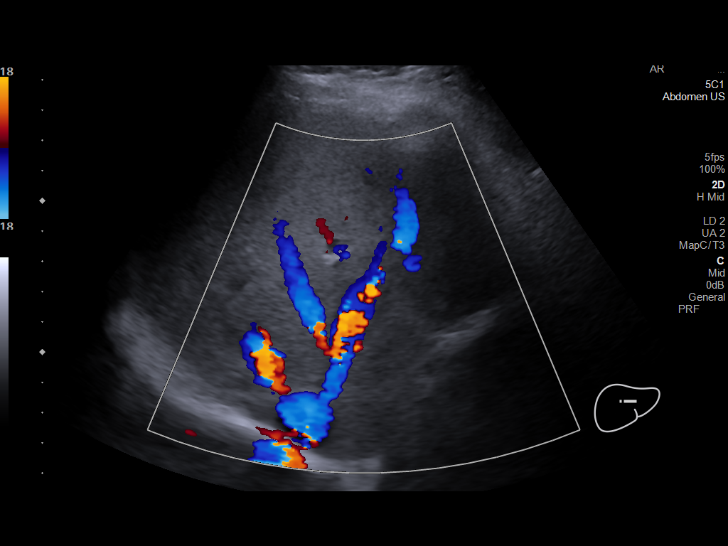
[im 49/49]
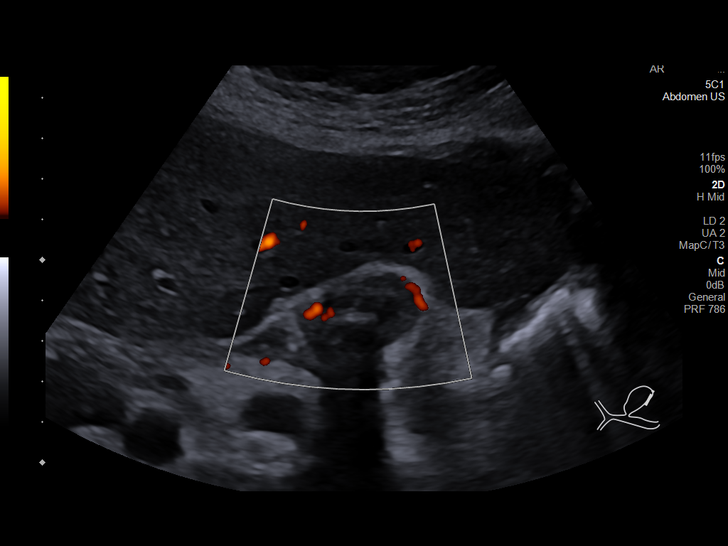

[14 of 25 positions shown; findings below may reference images not displayed]

FINDINGS: Gallbladder:

Multiple shadowing stones are present. This is essentially a wall
echo complex. The sonographer reports a sonographic Murphy sign.
Stones measure up to 1.4 cm.

Common bile duct:

Diameter: 5 mm, within normal limits.

Liver:

The liver is somewhat echogenic. No discrete lesions are present.
Portal vein is patent on color Doppler imaging with normal direction
of blood flow towards the liver.

Other: A right pleural effusion is noted. A node is identified in
the porta hepatis measuring 1.4 x 0.9 x 1.7 cm.
IMPRESSION: 1. Cholelithiasis with sonographic Murphy sign and minimal
pericholecystic fluid suggesting acute cholecystitis.
2. Right pleural effusion.
3. Indeterminate node in the porta hepatis.

## 2023-06-19 ENCOUNTER — Encounter (HOSPITAL_COMMUNITY): Payer: Self-pay

## 2023-06-19 ENCOUNTER — Other Ambulatory Visit: Payer: Self-pay

## 2023-06-19 ENCOUNTER — Emergency Department (HOSPITAL_COMMUNITY): Payer: Medicaid Other

## 2023-06-19 ENCOUNTER — Emergency Department (HOSPITAL_COMMUNITY)
Admission: EM | Admit: 2023-06-19 | Discharge: 2023-06-19 | Disposition: A | Discharge status: Home / Self Care | Payer: Medicaid Other | Attending: Emergency Medicine | Admitting: Emergency Medicine

## 2023-06-19 DIAGNOSIS — R531 Weakness: Secondary | ICD-10-CM | POA: Insufficient documentation

## 2023-06-19 DIAGNOSIS — R630 Anorexia: Secondary | ICD-10-CM | POA: Insufficient documentation

## 2023-06-19 DIAGNOSIS — R5383 Other fatigue: Secondary | ICD-10-CM | POA: Insufficient documentation

## 2023-06-19 DIAGNOSIS — Z1152 Encounter for screening for COVID-19: Secondary | ICD-10-CM | POA: Diagnosis not present

## 2023-06-19 DIAGNOSIS — R001 Bradycardia, unspecified: Secondary | ICD-10-CM | POA: Diagnosis not present

## 2023-06-19 DIAGNOSIS — J40 Bronchitis, not specified as acute or chronic: Secondary | ICD-10-CM | POA: Diagnosis not present

## 2023-06-19 LAB — CBC WITH DIFFERENTIAL/PLATELET
Abs Immature Granulocytes: 0.02 10*3/uL (ref 0.00–0.07)
Basophils Absolute: 0 10*3/uL (ref 0.0–0.1)
Basophils Relative: 1 %
Eosinophils Absolute: 0.1 10*3/uL (ref 0.0–0.5)
Eosinophils Relative: 1 %
HCT: 48.8 % — ABNORMAL HIGH (ref 36.0–46.0)
Hemoglobin: 16.1 g/dL — ABNORMAL HIGH (ref 12.0–15.0)
Immature Granulocytes: 0 %
Lymphocytes Relative: 23 %
Lymphs Abs: 1.6 10*3/uL (ref 0.7–4.0)
MCH: 29.1 pg (ref 26.0–34.0)
MCHC: 33 g/dL (ref 30.0–36.0)
MCV: 88.1 fL (ref 80.0–100.0)
Monocytes Absolute: 0.3 10*3/uL (ref 0.1–1.0)
Monocytes Relative: 4 %
Neutro Abs: 5.1 10*3/uL (ref 1.7–7.7)
Neutrophils Relative %: 71 %
Platelets: 234 10*3/uL (ref 150–400)
RBC: 5.54 MIL/uL — ABNORMAL HIGH (ref 3.87–5.11)
RDW: 12.1 % (ref 11.5–15.5)
WBC: 7.2 10*3/uL (ref 4.0–10.5)
nRBC: 0 % (ref 0.0–0.2)

## 2023-06-19 LAB — MAGNESIUM: Magnesium: 2.1 mg/dL (ref 1.7–2.4)

## 2023-06-19 LAB — COMPREHENSIVE METABOLIC PANEL
ALT: 17 U/L (ref 0–44)
AST: 16 U/L (ref 15–41)
Albumin: 4.1 g/dL (ref 3.5–5.0)
Alkaline Phosphatase: 47 U/L (ref 38–126)
Anion gap: 11 (ref 5–15)
BUN: 10 mg/dL (ref 6–20)
CO2: 28 mmol/L (ref 22–32)
Calcium: 9.7 mg/dL (ref 8.9–10.3)
Chloride: 98 mmol/L (ref 98–111)
Creatinine, Ser: 0.96 mg/dL (ref 0.44–1.00)
GFR, Estimated: >60 mL/min (ref >60)
Glucose, Bld: 166 mg/dL — ABNORMAL HIGH (ref 70–99)
Potassium: 3.7 mmol/L (ref 3.5–5.1)
Sodium: 137 mmol/L (ref 135–145)
Total Bilirubin: 0.7 mg/dL (ref 0.0–1.2)
Total Protein: 7.3 g/dL (ref 6.5–8.1)

## 2023-06-19 LAB — RESP PANEL BY RT-PCR (RSV, FLU A&B, COVID)  RVPGX2
Influenza A by PCR: NEGATIVE
Influenza B by PCR: NEGATIVE
Resp Syncytial Virus by PCR: NEGATIVE
SARS Coronavirus 2 by RT PCR: NEGATIVE

## 2023-06-19 NOTE — ED Triage Notes (Signed)
Pt arrived via POV c/o weakness, fatigue for the past month. Pt reports decreased appetite. Pt presents in NAD. Pt denies pain.

## 2023-06-19 NOTE — Discharge Instructions (Signed)
 Your workup this evening was reassuring.  I do recommend that you have close outpatient follow-up with PCP.  If you do not have 1, I have listed 2 of the local clinics that you may contact to establish primary care.  Return to the emergency department for any new or worsening symptoms.

## 2023-06-19 NOTE — ED Provider Triage Note (Signed)
 Emergency Medicine Provider Triage Evaluation Note  Ann Hoover , a 43 y.o. female  was evaluated in triage.  Pt complains of generalized weakness and fatigue for 1 month.  Symptoms have been present for 1 month.  She also endorses decreased appetite.  Denies any new or worsening symptoms.  Was diagnosed with flu a week or so ago but states she had weakness prior to that.  She denies any chest pain abdominal pain vomiting or diarrhea.  No fever or chills.  Review of Systems  Positive: Generalized weakness, fatigue Negative: Fever, chills, vomiting, diarrhea chest pain or shortness of breath  Physical Exam  BP 128/87 (BP Location: Right Arm)   Pulse 84   Temp 98.5 F (36.9 C) (Oral)   Resp 18   Ht 5' 8 (1.727 m)   Wt 65 kg   SpO2 99%   BMI 21.79 kg/m  Gen:   Awake, no distress   Resp:  Normal effort  MSK:   Moves extremities without difficulty   Medical Decision Making  Medically screening exam initiated at 5:41 PM.  Appropriate orders placed.  Ann Hoover was informed that the remainder of the evaluation will be completed by another provider, this initial triage assessment does not replace that evaluation, and the importance of remaining in the ED until their evaluation is complete.     Herlinda Milling, PA-C 06/19/23 1746

## 2023-06-19 NOTE — ED Provider Notes (Signed)
 [place] EMERGENCY DEPARTMENT AT [facility] Provider Note   CSN: [id number] Arrival date & time: 06/19/23  1021     History  Chief Complaint  Patient presents with   Weakness    Ann Hoover is a 43 y.o. female.   Weakness Associated symptoms: no abdominal pain, no arthralgias, no chest pain, no cough, no diarrhea, no dizziness, no dysuria, no fever, no headaches, no myalgias, no nausea, no shortness of breath and no vomiting        Ann Hoover is a 43 y.o. female who presents to the Emergency Department complaining of generalized weakness and fatigue x 1 month.  She also endorses having decreased appetite.  No chest pain, fever, chills, shortness of breath, cough or abdominal pain.  States she had the flu a few weeks ago and does not feel that she fully recovered from this.  She was having some weakness prior to that as well.  No headache or dizziness  Home Medications Prior to Admission medications   Medication Sig Start Date End Date Taking? Authorizing Provider  oxyCODONE  (ROXICODONE ) 5 MG immediate release tablet Take 1 tablet (5 mg total) by mouth every 4 (four) hours as needed for severe pain or breakthrough pain. 05/31/21   Kallie Manuelita BROCKS, MD  pantoprazole  (PROTONIX ) 20 MG tablet Take 1 tablet (20 mg total) by mouth daily. 05/16/21   Caccavale, Sophia, PA-C      Allergies    Patient has no known allergies.    Review of Systems   Review of Systems  Constitutional:  Negative for chills and fever.  Respiratory:  Negative for cough and shortness of breath.   Cardiovascular:  Negative for chest pain.  Gastrointestinal:  Negative for abdominal pain, diarrhea, nausea and vomiting.  Genitourinary:  Negative for dysuria.  Musculoskeletal:  Negative for arthralgias and myalgias.  Neurological:  Positive for weakness. Negative for dizziness and headaches.  Psychiatric/Behavioral:  Negative for confusion.     Physical Exam Updated Vital Signs BP 107/62    Pulse 65   Temp 99.5 F (37.5 C) (Oral)   Resp 17   Ht 5' 8 (1.727 m)   Wt 65 kg   SpO2 98%   BMI 21.79 kg/m  Physical Exam Vitals and nursing note reviewed.  Constitutional:      General: She is not in acute distress.    Appearance: Normal appearance. She is not ill-appearing or toxic-appearing.  Eyes:     Conjunctiva/sclera: Conjunctivae normal.     Pupils: Pupils are equal, round, and reactive to light.  Cardiovascular:     Rate and Rhythm: Normal rate and regular rhythm.     Pulses: Normal pulses.  Pulmonary:     Effort: Pulmonary effort is normal.  Abdominal:     Palpations: Abdomen is soft.     Tenderness: There is no abdominal tenderness.  Musculoskeletal:        General: Normal range of motion.  Skin:    General: Skin is warm.     Capillary Refill: Capillary refill takes less than 2 seconds.  Neurological:     General: No focal deficit present.     Mental Status: She is alert.     Sensory: No sensory deficit.     Motor: No weakness.     ED Results / Procedures / Treatments   Labs (all labs ordered are listed, but only abnormal results are displayed) Labs Reviewed  CBC WITH DIFFERENTIAL/PLATELET - Abnormal; Notable for the following  components:      Result Value   RBC 5.54 (*)    Hemoglobin 16.1 (*)    HCT 48.8 (*)    All other components within normal limits  COMPREHENSIVE METABOLIC PANEL - Abnormal; Notable for the following components:   Glucose, Bld 166 (*)    All other components within normal limits  RESP PANEL BY RT-PCR (RSV, FLU A&B, COVID)  RVPGX2  MAGNESIUM    EKG None  Radiology DG Chest 2 View Result Date: 06/19/2023 CLINICAL DATA:  Weakness EXAM: CHEST - 2 VIEW COMPARISON:  12/08/2017 FINDINGS: No acute airspace disease or effusion. Mild diffuse bronchitic changes. Normal cardiac size. No pneumothorax IMPRESSION: No active cardiopulmonary disease. Mild diffuse bronchitic changes. Electronically Signed   By: Luke Bun M.D.   On:  06/19/2023 18:20    Procedures Procedures    Medications Ordered in ED Medications - No data to display  ED Course/ Medical Decision Making/ A&P Clinical Course as of 06/19/23 2133  Tue Jun 19, 2023  1237 Lymphs Abs: 1.6 [RP]    Clinical Course User Index [RP] Yolande Lamar BROCKS, MD                                 Medical Decision Making Patient here with complaint of fatigue and generalized weakness x 1 month.  No associated symptoms.  Recovering from flu, but states she had some weakness prior to having the flu.  Has not followed up with PCP.  No new or worsening symptoms.   My exam, patient is well-appearing.  Nontoxic.  She has reassuring vital signs.  Cause of her generalized weakness and fatigue is unclear at this time, possibly viral, electrolyte abnormality, hypomagnesemia.  Will check labs, EKG chest x-ray.  If reassuring anticipate discharge home.  Amount and/or Complexity of Data Reviewed Labs: ordered.    Details: Labs interpreted by me, no evidence of leukocytosis, chemistries without derangement, magnesium reassuring.  viral panel negative. Radiology: ordered.    Details: Chest x-ray without acute cardio or pulmonary process ECG/medicine tests: ordered.    Details: EKG was reviewed by Dr. Cleotilde.  Did not cross into MUSE Discussion of management or test interpretation with external provider(s): Recheck, patient resting comfortably.  Cause of her generalized weakness is unclear, but doubt emergent process.  Patient appears appropriate for discharge home, she will follow-up closely outpatient with PCP.  Return precautions were given           Final Clinical Impression(s) / ED Diagnoses Final diagnoses:  Generalized weakness    Rx / DC Orders ED Discharge Orders     None         Ann Hoover 06/19/23 2143    Cleotilde Rogue, MD 06/20/23 1329

## 2023-06-21 DIAGNOSIS — F4321 Adjustment disorder with depressed mood: Secondary | ICD-10-CM | POA: Diagnosis not present

## 2023-06-21 DIAGNOSIS — F17200 Nicotine dependence, unspecified, uncomplicated: Secondary | ICD-10-CM | POA: Diagnosis not present

## 2023-06-21 DIAGNOSIS — Z7689 Persons encountering health services in other specified circumstances: Secondary | ICD-10-CM | POA: Diagnosis not present

## 2023-07-09 DIAGNOSIS — F329 Major depressive disorder, single episode, unspecified: Secondary | ICD-10-CM | POA: Diagnosis not present

## 2023-07-09 DIAGNOSIS — M542 Cervicalgia: Secondary | ICD-10-CM | POA: Diagnosis not present
